# Patient Record
Sex: Female | Born: 1990 | Hispanic: No | Marital: Married | Smoking: Former smoker
Health system: Southern US, Community
[De-identification: ages and names within clinical notes are randomized; demographics above are authoritative.]

## PROBLEM LIST (undated history)

## (undated) DIAGNOSIS — J45909 Unspecified asthma, uncomplicated: Secondary | ICD-10-CM

## (undated) DIAGNOSIS — Z9889 Other specified postprocedural states: Secondary | ICD-10-CM

## (undated) DIAGNOSIS — R112 Nausea with vomiting, unspecified: Secondary | ICD-10-CM

## (undated) HISTORY — PX: ELBOW SURGERY: SHX618

## (undated) HISTORY — DX: Unspecified asthma, uncomplicated: J45.909

## (undated) HISTORY — DX: Other specified postprocedural states: Z98.890

## (undated) HISTORY — DX: Nausea with vomiting, unspecified: R11.2

## (undated) HISTORY — PX: OTHER SURGICAL HISTORY: SHX169

---

## 2018-07-13 ENCOUNTER — Encounter: Payer: Self-pay | Admitting: Adult Health

## 2018-07-13 ENCOUNTER — Ambulatory Visit (INDEPENDENT_AMBULATORY_CARE_PROVIDER_SITE_OTHER): Payer: 59 | Admitting: Adult Health

## 2018-07-13 VITALS — BP 96/72 | HR 98 | Temp 98.1°F | Wt 139.0 lb

## 2018-07-13 DIAGNOSIS — J029 Acute pharyngitis, unspecified: Secondary | ICD-10-CM | POA: Diagnosis not present

## 2018-07-13 LAB — POCT RAPID STREP A (OFFICE): RAPID STREP A SCREEN: NEGATIVE

## 2018-07-13 MED ORDER — PREDNISONE 20 MG PO TABS: 20.0000 mg | ORAL_TABLET | Freq: Every day | ORAL | 0 refills | Status: AC

## 2018-07-13 NOTE — Progress Notes (Signed)
Subjective:    Patient ID: Carla Escobar, female    DOB: March 16, 1991, 27 y.o.   MRN: [id number]  Sore Throat   This is a new problem. The current episode started in the past 7 days (2 days ). The problem has been unchanged. There has been no fever. Associated symptoms include coughing (semi productive ), swollen glands and trouble swallowing. Pertinent negatives include no abdominal pain, ear discharge, ear pain or headaches. She has had no exposure to strep or mono. The treatment provided no relief.    Review of Systems  HENT: Positive for trouble swallowing. Negative for ear discharge and ear pain.   Respiratory: Positive for cough (semi productive ).   Gastrointestinal: Negative for abdominal pain.  Neurological: Negative for headaches.   History reviewed. No pertinent past medical history.  Social History   Socioeconomic History  . Marital status: Unknown    Spouse name: Not on file  . Number of children: Not on file  . Years of education: Not on file  . Highest education level: Not on file  Occupational History  . Not on file  Social Needs  . Financial resource strain: Not on file  . Food insecurity:    Worry: Not on file    Inability: Not on file  . Transportation needs:    Medical: Not on file    Non-medical: Not on file  Tobacco Use  . Smoking status: Never Smoker  . Smokeless tobacco: Never Used  Substance and Sexual Activity  . Alcohol use: Not on file  . Drug use: Not on file  . Sexual activity: Not on file  Lifestyle  . Physical activity:    Days per week: Not on file    Minutes per session: Not on file  . Stress: Not on file  Relationships  . Social connections:    Talks on phone: Not on file    Gets together: Not on file    Attends religious service: Not on file    Active member of club or organization: Not on file    Attends meetings of clubs or organizations: Not on file    Relationship status: Not on file  . Intimate partner violence:    Fear of  current or ex partner: Not on file    Emotionally abused: Not on file    Physically abused: Not on file    Forced sexual activity: Not on file  Other Topics Concern  . Not on file  Social History Narrative  . Not on file    History reviewed. No pertinent surgical history.  History reviewed. No pertinent family history.  Not on File  No current outpatient medications on file prior to visit.   No current facility-administered medications on file prior to visit.     BP 96/72 (BP Location: Left Arm, Patient Position: Sitting, Cuff Size: Normal)   Pulse 98   Temp 98.1 F (36.7 C) (Oral)   Wt 139 lb (63 kg)   SpO2 99%       Objective:   Physical Exam  Constitutional: She is oriented to person, place, and time. She appears well-developed and well-nourished. No distress.  HENT:  Right Ear: Hearing, tympanic membrane, external ear and ear canal normal.  Left Ear: Hearing, tympanic membrane, external ear and ear canal normal.  Nose: Nose normal. No mucosal edema or rhinorrhea. Right sinus exhibits no maxillary sinus tenderness and no frontal sinus tenderness. Left sinus exhibits no maxillary sinus tenderness and no frontal  sinus tenderness.  Mouth/Throat: Uvula is midline. Posterior oropharyngeal erythema present. No oropharyngeal exudate, posterior oropharyngeal edema or tonsillar abscesses.  Neck: Normal range of motion. Neck supple. No thyromegaly present.  Cardiovascular: Normal rate, regular rhythm, normal heart sounds and intact distal pulses.  Pulmonary/Chest: Effort normal and breath sounds normal.  Lymphadenopathy:       Head (right side): Submandibular and tonsillar adenopathy present.       Head (left side): Submandibular and tonsillar adenopathy present.  Neurological: She is alert and oriented to person, place, and time.  Skin: Skin is warm and dry. Capillary refill takes less than 2 seconds. She is not diaphoretic.  Psychiatric: She has a normal mood and affect. Her  behavior is normal. Judgment and thought content normal.  Nursing note and vitals reviewed.     Assessment & Plan:  1. Sore throat  - POC Rapid Strep A- negative  - likely viral  - predniSONE (DELTASONE) 20 MG tablet; Take 1 tablet (20 mg total) by mouth daily with breakfast for 5 days.  Dispense: 5 tablet; Refill: 0 - Follow up in 2-3 days if no improvement   Shirline Frees, NP

## 2018-07-13 NOTE — Patient Instructions (Addendum)
It was great meeting you today   Your strep test is negative   You likely have a viral sore throat. I have sent in prednisone.   Please follow up with me by Friday if you are not feeling any better       Sore Throat A sore throat is pain, burning, irritation, or scratchiness in the throat. When you have a sore throat, you may feel pain or tenderness in your throat when you swallow or talk. Many things can cause a sore throat, including:  An infection.  Seasonal allergies.  Dryness in the air.  Irritants, such as smoke or pollution.  Gastroesophageal reflux disease (GERD).  A tumor.  A sore throat is often the first sign of another sickness. It may happen with other symptoms, such as coughing, sneezing, fever, and swollen neck glands. Most sore throats go away without medical treatment. Follow these instructions at home:  Take over-the-counter medicines only as told by your health care provider.  Drink enough fluids to keep your urine clear or pale yellow.  Rest as needed.  To help with pain, try: ? Sipping warm liquids, such as broth, herbal tea, or warm water. ? Eating or drinking cold or frozen liquids, such as frozen ice pops. ? Gargling with a salt-water mixture 3-4 times a day or as needed. To make a salt-water mixture, completely dissolve -1 tsp of salt in 1 cup of warm water. ? Sucking on hard candy or throat lozenges. ? Putting a cool-mist humidifier in your bedroom at night to moisten the air. ? Sitting in the bathroom with the door closed for 5-10 minutes while you run hot water in the shower.  Do not use any tobacco products, such as cigarettes, chewing tobacco, and e-cigarettes. If you need help quitting, ask your health care provider. Contact a health care provider if:  You have a fever for more than 2-3 days.  You have symptoms that last (are persistent) for more than 2-3 days.  Your throat does not get better within 7 days.  You have a fever and  your symptoms suddenly get worse. Get help right away if:  You have difficulty breathing.  You cannot swallow fluids, soft foods, or your saliva.  You have increased swelling in your throat or neck.  You have persistent nausea and vomiting. This information is not intended to replace advice given to you by your health care provider. Make sure you discuss any questions you have with your health care provider. Document Released: 11/07/2004 Document Revised: 05/26/2016 Document Reviewed: 07/21/2015 Elsevier Interactive Patient Education  [employer].

## 2018-07-23 ENCOUNTER — Ambulatory Visit (INDEPENDENT_AMBULATORY_CARE_PROVIDER_SITE_OTHER): Payer: 59 | Admitting: Family Medicine

## 2018-07-23 ENCOUNTER — Encounter: Payer: Self-pay | Admitting: Family Medicine

## 2018-07-23 VITALS — BP 110/50 | HR 64 | Temp 98.1°F | Ht 65.5 in | Wt 140.0 lb

## 2018-07-23 DIAGNOSIS — J4520 Mild intermittent asthma, uncomplicated: Secondary | ICD-10-CM

## 2018-07-23 DIAGNOSIS — Z13220 Encounter for screening for lipoid disorders: Secondary | ICD-10-CM | POA: Diagnosis not present

## 2018-07-23 DIAGNOSIS — R5383 Other fatigue: Secondary | ICD-10-CM

## 2018-07-23 DIAGNOSIS — Z0000 Encounter for general adult medical examination without abnormal findings: Secondary | ICD-10-CM

## 2018-07-23 DIAGNOSIS — J45909 Unspecified asthma, uncomplicated: Secondary | ICD-10-CM | POA: Insufficient documentation

## 2018-07-23 MED ORDER — LEVALBUTEROL TARTRATE 45 MCG/ACT IN AERO: 1.0000 | INHALATION_SPRAY | RESPIRATORY_TRACT | 5 refills | Status: AC | PRN

## 2018-07-23 NOTE — Progress Notes (Signed)
Carla Escobar DOB: 1991-04-22 Encounter date: 07/23/2018  This isa 27 y.o. female who presents to establish care. Chief Complaint  Patient presents with  . New Patient (Initial Visit)    no new concerns,    History of present illness: Hasn't been seeing doc on regular basis.   Done with law school in 5 weeks. Increased stress and not sleeping as well. Feels this is what has been running down immune system somewhat.  Asthma: Had this when little; has always had inhaler. More exercise induced; sometimes gets chest pain with working out. Had significant testing when 11-12 y/o and found just to be asthma. Doesn't regularly use inhaler now due to palpitations/light headedness when she uses this. Deals with slight wheeze.   Strong faith, relationships. Good support system. Takes Saturday completely off which has been helpful for her. Exercises 4-5 times weekly.   Past Medical History:  Diagnosis Date  . Asthma    Past Surgical History:  Procedure Laterality Date  . none     No Known Allergies No outpatient medications have been marked as taking for the 07/23/18 encounter (Office Visit) with Caren Macadam, MD.   Social History   Tobacco Use  . Smoking status: Never Smoker  . Smokeless tobacco: Never Used  . Tobacco comment: (smoked in high school for 1 year)  Substance Use Topics  . Alcohol use: Yes    Alcohol/week: 1.0 standard drinks    Types: 1 Glasses of wine per week   Family History  Problem Relation Age of Onset  . High blood pressure Father   . Heart disease Father   . Alcohol abuse Father   . Multiple myeloma Maternal Grandmother 76  . Bone cancer Maternal Grandfather 81  . Leukemia Paternal Grandmother   . Bone cancer Paternal Grandfather      Review of Systems  Constitutional: Positive for fatigue. Negative for activity change, appetite change, chills, fever and unexpected weight change.  HENT: Negative for congestion, ear pain, hearing loss, sinus  pressure, sinus pain, sore throat and trouble swallowing.   Eyes: Negative for pain and visual disturbance.  Respiratory: Positive for wheezing. Negative for cough, chest tightness and shortness of breath.   Cardiovascular: Negative for chest pain, palpitations and leg swelling.  Gastrointestinal: Negative for abdominal pain, blood in stool, constipation, diarrhea, nausea and vomiting.  Genitourinary: Negative for difficulty urinating and menstrual problem.  Musculoskeletal: Negative for arthralgias and back pain.  Skin: Negative for rash.  Neurological: Negative for dizziness, weakness, numbness and headaches.  Hematological: Negative for adenopathy. Does not bruise/bleed easily.  Psychiatric/Behavioral: Negative for sleep disturbance and suicidal ideas. The patient is not nervous/anxious.        Some stress but handles this with exercise, good support system/faith (see HPI)     Objective:  BP (!) 110/50 (BP Location: Left Arm, Patient Position: Sitting, Cuff Size: Normal)   Pulse 64   Temp 98.1 F (36.7 C) (Oral)   Ht 5' 5.5" (1.664 m)   Wt 140 lb (63.5 kg)   SpO2 99%   BMI 22.94 kg/m   Weight: 140 lb (63.5 kg)   BP Readings from Last 3 Encounters:  07/23/18 (!) 110/50  07/13/18 96/72   Wt Readings from Last 3 Encounters:  07/23/18 140 lb (63.5 kg)  07/13/18 139 lb (63 kg)    Physical Exam  Constitutional: She is oriented to person, place, and time. She appears well-developed and well-nourished. No distress.  Cardiovascular: Normal rate, regular rhythm and  normal heart sounds. Exam reveals no friction rub.  No murmur heard. No lower extremity edema  Pulmonary/Chest: Effort normal and breath sounds normal. No respiratory distress. She has no wheezes. She has no rales.  Neurological: She is alert and oriented to person, place, and time.  Psychiatric: Her behavior is normal. Cognition and memory are normal.    Assessment/Plan: Preventative Health Care: sees obgyn; have  asked her to have them cc [place] on future visit notes. Keep up regular exercise and healthy eating.  1. Mild intermittent asthma, unspecified whether complicated Trial of Xopenex for asthma symptoms as needed.  She has had tachycardia that is her from using albuterol at times when she feels she might need it.  I do feel that it is worth a try of Xopenex to see if the symptoms are less severe and she can still get the bronchodilation she needs.  She has also not tolerated steroid inhalers due to tachycardia. - levalbuterol (XOPENEX HFA) 45 MCG/ACT inhaler; Inhale 1-2 puffs into the lungs every 4 (four) hours as needed for wheezing.  Dispense: 1 Inhaler; Refill: 5  2. Fatigue, unspecified type She suspects that fatigue is more stress and current lifestyle related.  However I think it is reasonable to obtain just a baseline panel and make sure no obvious abnormalities that would contribute to increased fatigue. - Comprehensive metabolic panel; Future - CBC with Differential/Platelet; Future - TSH; Future  3. Lipid screening  - Lipid panel; Future   Return in about 1 year (around 07/24/2019).  Micheline Rough, MD  Declines flu shot: all times she has had it she has gotten sick. Migraine, vomiting, diarrhea.

## 2018-07-23 NOTE — Patient Instructions (Signed)
Trial of xopenex for asthma symptoms when needed.   bloodwork when able.

## 2018-07-28 ENCOUNTER — Ambulatory Visit: Payer: Self-pay

## 2018-07-28 NOTE — Telephone Encounter (Signed)
Returned call to pt.  Reported she had onset of swollen, tender lymph nodes, and gen. muscle aches last night about 6:00 PM.  Reported her the lymph nodes are swollen in neck, proximal to ears, and under arms.  Reported the lymph nodes are swollen "about lima bean size".  Denied any scratches, or break in skin under her arms.  Stated she has been chilling.  Temperature 98.3 about 12:00 PM.  stated she rarely has fever, even when she is sick; temp. usually is subnormal.  Also c/o sore throat, mild headache, mildly runny nose, and infreq. cough; reported light green mucus from nose, and with intermittent productive cough.  Stated she has been sick with viral symptoms 2 times within past month.  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  Advised she will need to be evaluated for current symptoms.  Appt. given 10/16 @ 3:30 PM.  Care advice given per protocol.  Pt. Verb. Understanding.  Agrees with plan.       Reason for Disposition . [1] Tender node in the neck AND [2] also has a sore throat AND [3] minimal/no runny nose or cough  Answer Assessment - Initial Assessment Questions 1. LOCATION: "Where is the swollen node located?" "Is the matching node on the other side of the body also swollen?"      Swollen lymph nodes in neck, prox to ears, bilat, and axillary lymph nodes 2. SIZE: "How big is the node?" (Inches or centimeters) (or compare to common objects such as pea, bean, marble, golf ball)      "Lima bean size" 3. ONSET: "When did the swelling start?"      6:00 PM 10/14 4. NECK NODES: "Is there a sore throat, runny nose or other symptoms of a cold?"      Denied sore throat, runny nose of light green; infreq cough of light green mucus 5. GROIN OR ARMPIT NODES: "Is there a sore, scratch, cut or painful red area on that arm or leg?"      Denied any sore area under arm; denied any redness or skin changes  6. FEVER: "Do you  have a fever?" If so, ask: "What is it, how was it measured, and when did it start?"      Denied fever; reported she usually has a low body temperature; reported chills last night ; Temp. 98.3 7. CAUSE: "What do you think is causing the swollen lymph nodes?"     In [employer]; has been exposed to other students with cough, fever, aching  8. OTHER SYMPTOMS: "Do you have any other symptoms?"     Mild headache  9. PREGNANCY: "Is there any chance you are pregnant?" "When was your last menstrual period?"     LMP 5 days ago; has an IUD  Protocols used: LYMPH NODES [facility]  Message from Tamela Oddi sent at 07/28/2018 12:25 PM EDT   Patient called to speak with a nurse because she is having flu like symptoms such as lymph nodes swollen, achy all over, and this is the third time this month that she has been sick. Patient stated that she has seen the doctor 2x this month and wanted to streamline and speak with a nurse to get an alternative solution instead of seeing the doctor again. Patient's CB# [phone].

## 2018-07-29 ENCOUNTER — Ambulatory Visit (INDEPENDENT_AMBULATORY_CARE_PROVIDER_SITE_OTHER): Payer: 59 | Admitting: Adult Health

## 2018-07-29 ENCOUNTER — Encounter: Payer: Self-pay | Admitting: Adult Health

## 2018-07-29 VITALS — BP 100/60 | Temp 98.3°F | Wt 140.0 lb

## 2018-07-29 DIAGNOSIS — Z13220 Encounter for screening for lipoid disorders: Secondary | ICD-10-CM | POA: Diagnosis not present

## 2018-07-29 DIAGNOSIS — B349 Viral infection, unspecified: Secondary | ICD-10-CM

## 2018-07-29 DIAGNOSIS — R5383 Other fatigue: Secondary | ICD-10-CM | POA: Diagnosis not present

## 2018-07-29 LAB — POCT MONO (EPSTEIN BARR VIRUS): MONO, POC: NEGATIVE

## 2018-07-29 NOTE — Progress Notes (Signed)
 Subjective:    Patient ID: Carla Escobar, female    DOB: 05/18/1991, 27 y.o.   MRN: [id number]  HPI  27 year old female who  has a past medical history of Asthma.  She presents to the office today for continued headache, sore throat chills/subjective fever, achy muscles, fatigue, and cough. She was seen by this writer about two weeks ago for similar symptoms. Strep was negative and it was thought that her symptoms were likely viral in nature or do to lifestyle ( law school). She was prescribed prednisone to help with sore throat symptoms. She reports that she never took this medication as she started to feel better.   Since that time she has been seen by her PCP, labs were entered and she will have these done today. Her symptoms have been waxing and waning, worse in the morning.   Her husband has not become ill.   She has had two friends recently diagnosed with mono.    Review of Systems See HPI   Past Medical History:  Diagnosis Date  . Asthma     Social History   Socioeconomic History  . Marital status: Married    Spouse name: Not on file  . Number of children: Not on file  . Years of education: Not on file  . Highest education level: Not on file  Occupational History  . Not on file  Social Needs  . Financial resource strain: Not on file  . Food insecurity:    Worry: Not on file    Inability: Not on file  . Transportation needs:    Medical: Not on file    Non-medical: Not on file  Tobacco Use  . Smoking status: Never Smoker  . Smokeless tobacco: Never Used  . Tobacco comment: (smoked in high school for 1 year)  Substance and Sexual Activity  . Alcohol use: Yes    Alcohol/week: 1.0 standard drinks    Types: 1 Glasses of wine per week  . Drug use: Never  . Sexual activity: Yes    Partners: Male  Lifestyle  . Physical activity:    Days per week: Not on file    Minutes per session: Not on file  . Stress: Not on file  Relationships  . Social  connections:    Talks on phone: Not on file    Gets together: Not on file    Attends religious service: Not on file    Active member of club or organization: Not on file    Attends meetings of clubs or organizations: Not on file    Relationship status: Not on file  . Intimate partner violence:    Fear of current or ex partner: Not on file    Emotionally abused: Not on file    Physically abused: Not on file    Forced sexual activity: Not on file  Other Topics Concern  . Not on file  Social History Narrative  . Not on file    Past Surgical History:  Procedure Laterality Date  . none      Family History  Problem Relation Age of Onset  . High blood pressure Father   . Heart disease Father   . Alcohol abuse Father   . Multiple myeloma Maternal Grandmother 76  . Bone cancer Maternal Grandfather 73  . Leukemia Paternal Grandmother   . Bone cancer Paternal Grandfather     No Known Allergies  Current Outpatient Medications on File Prior to Visit    Medication Sig Dispense Refill  . levalbuterol (XOPENEX HFA) 45 MCG/ACT inhaler Inhale 1-2 puffs into the lungs every 4 (four) hours as needed for wheezing. (Patient not taking: Reported on 07/29/2018) 1 Inhaler 5   No current facility-administered medications on file prior to visit.     BP 100/60   Temp 98.3 F (36.8 C) (Oral)   Wt 140 lb (63.5 kg)   BMI 22.94 kg/m       Objective:   Physical Exam  Constitutional: She is oriented to person, place, and time. She appears well-developed and well-nourished. No distress.  HENT:  Head: Normocephalic and atraumatic.  Right Ear: External ear normal.  Left Ear: External ear normal.  Nose: Nose normal.  Mouth/Throat: Oropharynx is clear and moist. No oropharyngeal exudate.  Eyes: Pupils are equal, round, and reactive to light. Conjunctivae and EOM are normal. Right eye exhibits no discharge. Left eye exhibits no discharge. No scleral icterus.  Neck: Normal range of motion. Neck  supple. No thyromegaly present.  Cardiovascular: Normal rate, regular rhythm, normal heart sounds and intact distal pulses. Exam reveals no gallop and no friction rub.  No murmur heard. Pulmonary/Chest: Effort normal and breath sounds normal. No stridor. No respiratory distress. She has no wheezes. She has no rales. She exhibits no tenderness.  Abdominal: Soft. Bowel sounds are normal. She exhibits no distension and no mass. There is no hepatosplenomegaly, splenomegaly or hepatomegaly. There is no tenderness. There is no rebound and no guarding. No hernia.  Lymphadenopathy:       Head (right side): No submental, no submandibular, no tonsillar, no preauricular, no posterior auricular and no occipital adenopathy present.       Head (left side): No submental, no submandibular, no tonsillar, no preauricular, no posterior auricular and no occipital adenopathy present.    She has no cervical adenopathy.  Neurological: She is alert and oriented to person, place, and time.  Skin: Skin is warm and dry. She is not diaphoretic.  Psychiatric: She has a normal mood and affect. Her behavior is normal. Judgment and thought content normal.  Nursing note and vitals reviewed.     Assessment & Plan:  1. Acute viral syndrome - Will check POC mono. Unknown cause of illness but possibly related to lifestyle and stressors.  - POC Mono (Epstein Barr Virus)  2. Fatigue, unspecified type  - TSH - CBC with Differential/Platelet - Comprehensive metabolic panel  3. Lipid screening - Lipid panel   Dorothyann Peng, NP

## 2018-07-30 ENCOUNTER — Telehealth: Payer: Self-pay | Admitting: Family Medicine

## 2018-07-30 LAB — CBC WITH DIFFERENTIAL/PLATELET
BASOS ABS: 0.1 10*3/uL (ref 0.0–0.1)
Basophils Relative: 1 % (ref 0.0–3.0)
Eosinophils Absolute: 0.3 10*3/uL (ref 0.0–0.7)
Eosinophils Relative: 5 % (ref 0.0–5.0)
HCT: 43.3 % (ref 36.0–46.0)
HEMOGLOBIN: 14.4 g/dL (ref 12.0–15.0)
LYMPHS PCT: 27 % (ref 12.0–46.0)
Lymphs Abs: 1.7 10*3/uL (ref 0.7–4.0)
MCHC: 33.2 g/dL (ref 30.0–36.0)
MCV: 85.9 fl (ref 78.0–100.0)
MONO ABS: 0.8 10*3/uL (ref 0.1–1.0)
Monocytes Relative: 12.9 % — ABNORMAL HIGH (ref 3.0–12.0)
Neutro Abs: 3.4 10*3/uL (ref 1.4–7.7)
Neutrophils Relative %: 54.1 % (ref 43.0–77.0)
Platelets: 272 10*3/uL (ref 150.0–400.0)
RBC: 5.04 Mil/uL (ref 3.87–5.11)
RDW: 13 % (ref 11.5–15.5)
WBC: 6.3 10*3/uL (ref 4.0–10.5)

## 2018-07-30 LAB — COMPREHENSIVE METABOLIC PANEL
ALK PHOS: 50 U/L (ref 39–117)
ALT: 15 U/L (ref 0–35)
AST: 17 U/L (ref 0–37)
Albumin: 4.9 g/dL (ref 3.5–5.2)
BUN: 12 mg/dL (ref 6–23)
CO2: 27 meq/L (ref 19–32)
Calcium: 9.9 mg/dL (ref 8.4–10.5)
Chloride: 102 mEq/L (ref 96–112)
Creatinine, Ser: 0.87 mg/dL (ref 0.40–1.20)
GFR: 84.04 mL/min (ref >60.00)
GLUCOSE: 89 mg/dL (ref 70–99)
POTASSIUM: 4.2 meq/L (ref 3.5–5.1)
Sodium: 139 mEq/L (ref 135–145)
TOTAL PROTEIN: 7.5 g/dL (ref 6.0–8.3)
Total Bilirubin: 0.3 mg/dL (ref 0.2–1.2)

## 2018-07-30 LAB — LIPID PANEL
CHOL/HDL RATIO: 2
Cholesterol: 165 mg/dL (ref 0–200)
HDL: 73.7 mg/dL (ref >39.00)
LDL CALC: 79 mg/dL (ref 0–99)
NonHDL: 91.14
TRIGLYCERIDES: 62 mg/dL (ref 0.0–149.0)
VLDL: 12.4 mg/dL (ref 0.0–40.0)

## 2018-07-30 LAB — TSH: TSH: 2.07 u[IU]/mL (ref 0.35–4.50)

## 2018-07-30 NOTE — Telephone Encounter (Signed)
Copied from CRM [id number]. Topic: Quick Communication - See Telephone Encounter >> Jul 30, 2018  4:46 PM Jens Som A wrote: CRM for notification. See Telephone encounter for: 07/30/18.  Patient is requesting lab results please advise [phone]

## 2018-07-31 NOTE — Telephone Encounter (Signed)
Left message for patient to call back. CRM created. Please see result notes for further documentation

## 2019-01-31 ENCOUNTER — Encounter (HOSPITAL_COMMUNITY): Payer: Self-pay

## 2019-01-31 ENCOUNTER — Emergency Department (HOSPITAL_COMMUNITY)
Admission: EM | Admit: 2019-01-31 | Discharge: 2019-01-31 | Disposition: A | Discharge status: Home / Self Care | Payer: 59 | Attending: Emergency Medicine | Admitting: Emergency Medicine

## 2019-01-31 ENCOUNTER — Emergency Department (HOSPITAL_COMMUNITY): Payer: 59

## 2019-01-31 DIAGNOSIS — S52125A Nondisplaced fracture of head of left radius, initial encounter for closed fracture: Secondary | ICD-10-CM | POA: Diagnosis not present

## 2019-01-31 DIAGNOSIS — Y999 Unspecified external cause status: Secondary | ICD-10-CM | POA: Diagnosis not present

## 2019-01-31 DIAGNOSIS — Y929 Unspecified place or not applicable: Secondary | ICD-10-CM | POA: Diagnosis not present

## 2019-01-31 DIAGNOSIS — J45909 Unspecified asthma, uncomplicated: Secondary | ICD-10-CM | POA: Insufficient documentation

## 2019-01-31 DIAGNOSIS — S59912A Unspecified injury of left forearm, initial encounter: Secondary | ICD-10-CM | POA: Diagnosis present

## 2019-01-31 DIAGNOSIS — Y9355 Activity, bike riding: Secondary | ICD-10-CM | POA: Insufficient documentation

## 2019-01-31 DIAGNOSIS — R52 Pain, unspecified: Secondary | ICD-10-CM

## 2019-01-31 MED ORDER — MORPHINE SULFATE (PF) 4 MG/ML IV SOLN
4.0000 mg | Freq: Once | INTRAVENOUS | Status: DC
  Filled 2019-01-31: qty 1

## 2019-01-31 MED ORDER — HYDROCODONE-ACETAMINOPHEN 5-325 MG PO TABS
1.0000 | ORAL_TABLET | Freq: Once | ORAL | Status: AC
  Administered 2019-01-31: 1 via ORAL
  Filled 2019-01-31: qty 1

## 2019-01-31 MED ORDER — ONDANSETRON HCL 4 MG/2ML IJ SOLN
4.0000 mg | Freq: Once | INTRAMUSCULAR | Status: DC
  Filled 2019-01-31: qty 2

## 2019-01-31 MED ORDER — HYDROCODONE-ACETAMINOPHEN 5-325 MG PO TABS: 1.0000 | ORAL_TABLET | ORAL | 0 refills | Status: DC | PRN

## 2019-01-31 NOTE — ED Notes (Signed)
Patient verbalizes understanding of discharge instructions . Opportunity for questions and answers were provided . Armband removed by staff ,Pt discharged from ED. W/C  offered at D/C  and Declined W/C at D/C and was escorted to lobby by RN.  
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
## 2019-01-31 NOTE — ED Triage Notes (Addendum)
Unable to gain IV acces . EDP informed Pt wanted a PO pain meds

## 2019-01-31 NOTE — Discharge Instructions (Signed)
Call [facility] tomorrow morning for an appointment with Dr. Roney Mans this week.

## 2019-01-31 NOTE — ED Notes (Signed)
Patient transported to X-ray 
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
## 2020-01-27 DIAGNOSIS — Z30432 Encounter for removal of intrauterine contraceptive device: Secondary | ICD-10-CM | POA: Diagnosis not present

## 2020-01-27 DIAGNOSIS — Z124 Encounter for screening for malignant neoplasm of cervix: Secondary | ICD-10-CM | POA: Diagnosis not present

## 2020-01-27 DIAGNOSIS — Z01419 Encounter for gynecological examination (general) (routine) without abnormal findings: Secondary | ICD-10-CM | POA: Diagnosis not present

## 2020-01-27 DIAGNOSIS — Z1151 Encounter for screening for human papillomavirus (HPV): Secondary | ICD-10-CM | POA: Diagnosis not present

## 2020-03-08 DIAGNOSIS — Z3201 Encounter for pregnancy test, result positive: Secondary | ICD-10-CM | POA: Diagnosis not present

## 2020-03-08 DIAGNOSIS — Z3689 Encounter for other specified antenatal screening: Secondary | ICD-10-CM | POA: Diagnosis not present

## 2020-03-27 DIAGNOSIS — Z3201 Encounter for pregnancy test, result positive: Secondary | ICD-10-CM | POA: Diagnosis not present

## 2020-04-11 DIAGNOSIS — Z3689 Encounter for other specified antenatal screening: Secondary | ICD-10-CM | POA: Diagnosis not present

## 2020-04-11 DIAGNOSIS — Z36 Encounter for antenatal screening for chromosomal anomalies: Secondary | ICD-10-CM | POA: Diagnosis not present

## 2020-04-11 DIAGNOSIS — Z3401 Encounter for supervision of normal first pregnancy, first trimester: Secondary | ICD-10-CM | POA: Diagnosis not present

## 2020-04-11 LAB — OB RESULTS CONSOLE HIV ANTIBODY (ROUTINE TESTING): HIV: NONREACTIVE

## 2020-04-11 LAB — OB RESULTS CONSOLE HEPATITIS B SURFACE ANTIGEN: Hepatitis B Surface Ag: NEGATIVE

## 2020-04-11 LAB — OB RESULTS CONSOLE RPR: RPR: NONREACTIVE

## 2020-04-11 LAB — OB RESULTS CONSOLE RUBELLA ANTIBODY, IGM: Rubella: IMMUNE

## 2020-04-14 DIAGNOSIS — Z3401 Encounter for supervision of normal first pregnancy, first trimester: Secondary | ICD-10-CM | POA: Diagnosis not present

## 2020-05-02 DIAGNOSIS — Z3401 Encounter for supervision of normal first pregnancy, first trimester: Secondary | ICD-10-CM | POA: Diagnosis not present

## 2020-05-02 LAB — OB RESULTS CONSOLE GC/CHLAMYDIA
Chlamydia: NEGATIVE
Gonorrhea: NEGATIVE

## 2020-05-22 DIAGNOSIS — Z3402 Encounter for supervision of normal first pregnancy, second trimester: Secondary | ICD-10-CM | POA: Diagnosis not present

## 2020-05-22 DIAGNOSIS — Z361 Encounter for antenatal screening for raised alphafetoprotein level: Secondary | ICD-10-CM | POA: Diagnosis not present

## 2020-06-09 DIAGNOSIS — O358XX0 Maternal care for other (suspected) fetal abnormality and damage, not applicable or unspecified: Secondary | ICD-10-CM | POA: Diagnosis not present

## 2020-06-09 DIAGNOSIS — Z3A18 18 weeks gestation of pregnancy: Secondary | ICD-10-CM | POA: Diagnosis not present
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
## 2020-07-04 DIAGNOSIS — O358XX0 Maternal care for other (suspected) fetal abnormality and damage, not applicable or unspecified: Secondary | ICD-10-CM | POA: Diagnosis not present

## 2020-07-04 DIAGNOSIS — Z3A22 22 weeks gestation of pregnancy: Secondary | ICD-10-CM | POA: Diagnosis not present

## 2020-07-28 DIAGNOSIS — Z3A25 25 weeks gestation of pregnancy: Secondary | ICD-10-CM | POA: Diagnosis not present

## 2020-07-28 DIAGNOSIS — O358XX0 Maternal care for other (suspected) fetal abnormality and damage, not applicable or unspecified: Secondary | ICD-10-CM | POA: Diagnosis not present

## 2020-08-07 DIAGNOSIS — Z3689 Encounter for other specified antenatal screening: Secondary | ICD-10-CM | POA: Diagnosis not present

## 2020-08-07 DIAGNOSIS — O358XX0 Maternal care for other (suspected) fetal abnormality and damage, not applicable or unspecified: Secondary | ICD-10-CM | POA: Diagnosis not present

## 2020-08-07 DIAGNOSIS — Z3A27 27 weeks gestation of pregnancy: Secondary | ICD-10-CM | POA: Diagnosis not present

## 2020-08-14 DIAGNOSIS — O360130 Maternal care for anti-D [Rh] antibodies, third trimester, not applicable or unspecified: Secondary | ICD-10-CM | POA: Diagnosis not present

## 2020-08-14 DIAGNOSIS — Z3A28 28 weeks gestation of pregnancy: Secondary | ICD-10-CM | POA: Diagnosis not present

## 2020-08-25 DIAGNOSIS — O360130 Maternal care for anti-D [Rh] antibodies, third trimester, not applicable or unspecified: Secondary | ICD-10-CM | POA: Diagnosis not present

## 2020-08-25 DIAGNOSIS — Z3A29 29 weeks gestation of pregnancy: Secondary | ICD-10-CM | POA: Diagnosis not present

## 2020-09-04 DIAGNOSIS — Z3A31 31 weeks gestation of pregnancy: Secondary | ICD-10-CM | POA: Diagnosis not present

## 2020-09-04 DIAGNOSIS — O360130 Maternal care for anti-D [Rh] antibodies, third trimester, not applicable or unspecified: Secondary | ICD-10-CM | POA: Diagnosis not present

## 2020-10-05 DIAGNOSIS — R8290 Unspecified abnormal findings in urine: Secondary | ICD-10-CM | POA: Diagnosis not present

## 2020-10-05 DIAGNOSIS — Z3685 Encounter for antenatal screening for Streptococcus B: Secondary | ICD-10-CM | POA: Diagnosis not present

## 2020-10-10 LAB — OB RESULTS CONSOLE GBS: GBS: POSITIVE

## 2020-10-12 DIAGNOSIS — Z23 Encounter for immunization: Secondary | ICD-10-CM | POA: Diagnosis not present

## 2020-10-14 NOTE — L&D Delivery Note (Signed)
Operative Delivery Note At 10:16 PM a viable and healthy female was delivered via Vaginal, [employer], operational).  Presentation: vertex; Position: Right,, Occiput,, Anterior; Station: +3.  Verbal consent: obtained from patient.  Risks and benefits discussed in detail.  Risks include, but are not limited to the risks of anesthesia, bleeding, infection, damage to maternal tissues, fetal cephalhematoma.  There is also the risk of inability to effect vaginal delivery of the head, or shoulder dystocia that cannot be resolved by established maneuvers, leading to the need for emergency cesarean section.  APGAR: 9, 9; weight  pending.   Placenta status: spontaneous, intact.   Cord:  with the following complications: loose  x one reduced on perineum.  Cord pH: na  Anesthesia:epidural  Instruments: Kiwi x 2 pulls Episiotomy: None Lacerations: 3rd degree, 3b Suture Repair: 2.0 vicryl and 0 vicryl Est. Blood Loss (mL):  400  Mom to postpartum.  Baby to Couplet care / Skin to Skin.  Fenris Cauble J 11/10/2020, 10:46 PM

## 2020-11-02 ENCOUNTER — Telehealth (HOSPITAL_COMMUNITY): Payer: Self-pay | Admitting: *Deleted

## 2020-11-02 ENCOUNTER — Encounter (HOSPITAL_COMMUNITY): Payer: Self-pay | Admitting: *Deleted

## 2020-11-02 NOTE — Telephone Encounter (Signed)
Preadmission screen  

## 2020-11-08 ENCOUNTER — Other Ambulatory Visit (HOSPITAL_COMMUNITY): Payer: BC Managed Care – PPO | Attending: Obstetrics and Gynecology

## 2020-11-08 ENCOUNTER — Other Ambulatory Visit: Payer: Self-pay | Admitting: Obstetrics and Gynecology

## 2020-11-10 ENCOUNTER — Other Ambulatory Visit: Payer: Self-pay

## 2020-11-10 ENCOUNTER — Inpatient Hospital Stay (HOSPITAL_COMMUNITY): Payer: BC Managed Care – PPO

## 2020-11-10 ENCOUNTER — Inpatient Hospital Stay (HOSPITAL_COMMUNITY)
Admission: AD | Admit: 2020-11-10 | Discharge: 2020-11-12 | DRG: 768 | Disposition: A | Discharge status: Home / Self Care | Payer: BC Managed Care – PPO | Attending: Obstetrics and Gynecology | Admitting: Obstetrics and Gynecology

## 2020-11-10 ENCOUNTER — Inpatient Hospital Stay (HOSPITAL_COMMUNITY): Payer: BC Managed Care – PPO | Admitting: Anesthesiology

## 2020-11-10 ENCOUNTER — Encounter (HOSPITAL_COMMUNITY): Payer: Self-pay | Admitting: Obstetrics and Gynecology

## 2020-11-10 DIAGNOSIS — J45909 Unspecified asthma, uncomplicated: Secondary | ICD-10-CM | POA: Diagnosis present

## 2020-11-10 DIAGNOSIS — Z20822 Contact with and (suspected) exposure to covid-19: Secondary | ICD-10-CM | POA: Diagnosis present

## 2020-11-10 DIAGNOSIS — O9952 Diseases of the respiratory system complicating childbirth: Secondary | ICD-10-CM | POA: Diagnosis present

## 2020-11-10 DIAGNOSIS — O99824 Streptococcus B carrier state complicating childbirth: Secondary | ICD-10-CM | POA: Diagnosis present

## 2020-11-10 DIAGNOSIS — D649 Anemia, unspecified: Secondary | ICD-10-CM | POA: Diagnosis present

## 2020-11-10 DIAGNOSIS — Z3490 Encounter for supervision of normal pregnancy, unspecified, unspecified trimester: Secondary | ICD-10-CM | POA: Diagnosis present

## 2020-11-10 DIAGNOSIS — Z87891 Personal history of nicotine dependence: Secondary | ICD-10-CM

## 2020-11-10 DIAGNOSIS — O480 Post-term pregnancy: Principal | ICD-10-CM | POA: Diagnosis present

## 2020-11-10 DIAGNOSIS — O9902 Anemia complicating childbirth: Secondary | ICD-10-CM | POA: Diagnosis present

## 2020-11-10 DIAGNOSIS — Z3A40 40 weeks gestation of pregnancy: Secondary | ICD-10-CM | POA: Diagnosis not present

## 2020-11-10 LAB — CBC
HCT: 38.1 % (ref 36.0–46.0)
Hemoglobin: 12.6 g/dL (ref 12.0–15.0)
MCH: 28.9 pg (ref 26.0–34.0)
MCHC: 33.1 g/dL (ref 30.0–36.0)
MCV: 87.4 fL (ref 80.0–100.0)
Platelets: 189 10*3/uL (ref 150–400)
RBC: 4.36 MIL/uL (ref 3.87–5.11)
RDW: 12.4 % (ref 11.5–15.5)
WBC: 10.3 10*3/uL (ref 4.0–10.5)
nRBC: 0 % (ref 0.0–0.2)

## 2020-11-10 LAB — TYPE AND SCREEN
ABO/RH(D): A NEG
Antibody Screen: NEGATIVE

## 2020-11-10 LAB — SARS CORONAVIRUS 2 BY RT PCR (HOSPITAL ORDER, PERFORMED IN ~~LOC~~ HOSPITAL LAB): SARS Coronavirus 2: NEGATIVE

## 2020-11-10 LAB — RPR: RPR Ser Ql: NONREACTIVE

## 2020-11-10 MED ORDER — OXYTOCIN-SODIUM CHLORIDE 30-0.9 UT/500ML-% IV SOLN: 2.5000 [IU]/h | INTRAVENOUS | Status: DC

## 2020-11-10 MED ORDER — SODIUM CHLORIDE 0.9 % IV SOLN
5.0000 10*6.[IU] | Freq: Once | INTRAVENOUS | Status: AC
  Administered 2020-11-10: 5 10*6.[IU] via INTRAVENOUS
  Filled 2020-11-10: qty 5

## 2020-11-10 MED ORDER — EPHEDRINE 5 MG/ML INJ: 10.0000 mg | INTRAVENOUS | Status: DC | PRN

## 2020-11-10 MED ORDER — LACTATED RINGERS IV SOLN
500.0000 mL | INTRAVENOUS | Status: DC | PRN
  Administered 2020-11-10 (×4): 500 mL via INTRAVENOUS

## 2020-11-10 MED ORDER — FENTANYL CITRATE (PF) 100 MCG/2ML IJ SOLN
INTRAMUSCULAR | Status: DC | PRN
  Administered 2020-11-10: 100 ug via EPIDURAL

## 2020-11-10 MED ORDER — DIPHENHYDRAMINE HCL 50 MG/ML IJ SOLN: 12.5000 mg | INTRAMUSCULAR | Status: DC | PRN

## 2020-11-10 MED ORDER — FENTANYL-BUPIVACAINE-NACL 0.5-0.125-0.9 MG/250ML-% EP SOLN
EPIDURAL | Status: DC | PRN
  Administered 2020-11-10: 12 mL/h via EPIDURAL

## 2020-11-10 MED ORDER — TRANEXAMIC ACID-NACL 1000-0.7 MG/100ML-% IV SOLN: 1000.0000 mg | Freq: Once | INTRAVENOUS | Status: DC

## 2020-11-10 MED ORDER — TERBUTALINE SULFATE 1 MG/ML IJ SOLN: 0.2500 mg | Freq: Once | INTRAMUSCULAR | Status: DC | PRN

## 2020-11-10 MED ORDER — PHENYLEPHRINE HCL (PRESSORS) 10 MG/ML IV SOLN
INTRAVENOUS | Status: DC | PRN
  Administered 2020-11-10: 120 ug via INTRAVENOUS
  Administered 2020-11-10: 80 ug via INTRAVENOUS
  Administered 2020-11-10: 40 ug via INTRAVENOUS
  Administered 2020-11-10 (×2): 80 ug via INTRAVENOUS

## 2020-11-10 MED ORDER — TRANEXAMIC ACID-NACL 1000-0.7 MG/100ML-% IV SOLN
INTRAVENOUS | Status: AC
  Administered 2020-11-10: 1000 mg
  Filled 2020-11-10: qty 100

## 2020-11-10 MED ORDER — FENTANYL-BUPIVACAINE-NACL 0.5-0.125-0.9 MG/250ML-% EP SOLN: 12.0000 mL/h | EPIDURAL | Status: DC | PRN

## 2020-11-10 MED ORDER — PHENYLEPHRINE 40 MCG/ML (10ML) SYRINGE FOR IV PUSH (FOR BLOOD PRESSURE SUPPORT)
PREFILLED_SYRINGE | INTRAVENOUS | Status: AC
  Filled 2020-11-10: qty 10

## 2020-11-10 MED ORDER — LIDOCAINE HCL (PF) 1 % IJ SOLN: 30.0000 mL | INTRAMUSCULAR | Status: DC | PRN

## 2020-11-10 MED ORDER — FENTANYL-BUPIVACAINE-NACL 0.5-0.125-0.9 MG/250ML-% EP SOLN
EPIDURAL | Status: AC
  Filled 2020-11-10: qty 250

## 2020-11-10 MED ORDER — ACETAMINOPHEN 325 MG PO TABS: 650.0000 mg | ORAL_TABLET | ORAL | Status: DC | PRN

## 2020-11-10 MED ORDER — PENICILLIN G POT IN DEXTROSE 60000 UNIT/ML IV SOLN
3.0000 10*6.[IU] | INTRAVENOUS | Status: DC
  Administered 2020-11-10 (×4): 3 10*6.[IU] via INTRAVENOUS
  Filled 2020-11-10 (×4): qty 50

## 2020-11-10 MED ORDER — EPHEDRINE SULFATE 50 MG/ML IJ SOLN
INTRAMUSCULAR | Status: DC | PRN
  Administered 2020-11-10: 5 mg via INTRAVENOUS

## 2020-11-10 MED ORDER — PHENYLEPHRINE 40 MCG/ML (10ML) SYRINGE FOR IV PUSH (FOR BLOOD PRESSURE SUPPORT): 80.0000 ug | PREFILLED_SYRINGE | INTRAVENOUS | Status: DC | PRN

## 2020-11-10 MED ORDER — OXYTOCIN-SODIUM CHLORIDE 30-0.9 UT/500ML-% IV SOLN
1.0000 m[IU]/min | INTRAVENOUS | Status: DC
  Filled 2020-11-10: qty 500

## 2020-11-10 MED ORDER — FENTANYL CITRATE (PF) 100 MCG/2ML IJ SOLN
100.0000 ug | INTRAMUSCULAR | Status: DC | PRN
  Administered 2020-11-10: 100 ug via INTRAVENOUS
  Filled 2020-11-10 (×2): qty 2

## 2020-11-10 MED ORDER — MISOPROSTOL 25 MCG QUARTER TABLET
25.0000 ug | ORAL_TABLET | ORAL | Status: DC | PRN
  Administered 2020-11-10 (×2): 25 ug via VAGINAL
  Filled 2020-11-10 (×2): qty 1

## 2020-11-10 MED ORDER — SOD CITRATE-CITRIC ACID 500-334 MG/5ML PO SOLN: 30.0000 mL | ORAL | Status: DC | PRN

## 2020-11-10 MED ORDER — OXYTOCIN BOLUS FROM INFUSION
333.0000 mL | Freq: Once | INTRAVENOUS | Status: AC
  Administered 2020-11-10: 333 mL via INTRAVENOUS

## 2020-11-10 MED ORDER — BUPIVACAINE HCL (PF) 0.25 % IJ SOLN
INTRAMUSCULAR | Status: DC | PRN
  Administered 2020-11-10: 1 mL via INTRATHECAL

## 2020-11-10 MED ORDER — LACTATED RINGERS IV SOLN: 500.0000 mL | Freq: Once | INTRAVENOUS | Status: DC

## 2020-11-10 MED ORDER — LACTATED RINGERS IV SOLN: INTRAVENOUS | Status: DC

## 2020-11-10 MED ORDER — EPHEDRINE 5 MG/ML INJ
10.0000 mg | INTRAVENOUS | Status: DC | PRN
  Filled 2020-11-10: qty 10

## 2020-11-10 MED ORDER — ONDANSETRON HCL 4 MG/2ML IJ SOLN
4.0000 mg | Freq: Four times a day (QID) | INTRAMUSCULAR | Status: DC | PRN
  Administered 2020-11-10: 4 mg via INTRAVENOUS
  Filled 2020-11-10: qty 2

## 2020-11-10 MED ORDER — PHENYLEPHRINE 40 MCG/ML (10ML) SYRINGE FOR IV PUSH (FOR BLOOD PRESSURE SUPPORT)
80.0000 ug | PREFILLED_SYRINGE | INTRAVENOUS | Status: DC | PRN
  Filled 2020-11-10: qty 10

## 2020-11-10 NOTE — Anesthesia Preprocedure Evaluation (Signed)
Anesthesia Evaluation  Patient identified by MRN, date of birth, ID band Patient awake    Reviewed: Allergy & Precautions, Patient's Chart, lab work & pertinent test results  History of Anesthesia Complications (+) PONV and history of anesthetic complications  Airway Mallampati: II  TM Distance: >3 FB Neck ROM: Full    Dental no notable dental hx.    Pulmonary asthma , former smoker,    Pulmonary exam normal breath sounds clear to auscultation       Cardiovascular negative cardio ROS Normal cardiovascular exam Rhythm:Regular Rate:Normal     Neuro/Psych negative neurological ROS  negative psych ROS   GI/Hepatic negative GI ROS, Neg liver ROS,   Endo/Other  negative endocrine ROS  Renal/GU negative Renal ROS  negative genitourinary   Musculoskeletal negative musculoskeletal ROS (+)   Abdominal   Peds negative pediatric ROS (+)  Hematology negative hematology ROS (+) hct 38.1, plt 189   Anesthesia Other Findings   Reproductive/Obstetrics (+) Pregnancy G1P0 "natural delivery" stalled at 9.5cm, requesting epidural                              Anesthesia Physical Anesthesia Plan  ASA: II and emergent  Anesthesia Plan: Epidural   Post-op Pain Management:    Induction:   PONV Risk Score and Plan: 2  Airway Management Planned: Natural Airway  Additional Equipment: None  Intra-op Plan:   Post-operative Plan:   Informed Consent: I have reviewed the patients History and Physical, chart, labs and discussed the procedure including the risks, benefits and alternatives for the proposed anesthesia with the patient or authorized representative who has indicated his/her understanding and acceptance.       Plan Discussed with:   Anesthesia Plan Comments:         Anesthesia Quick Evaluation

## 2020-11-10 NOTE — Progress Notes (Signed)
Carla Escobar is a 30 y.o. G1P0 at [redacted]w[redacted]d by LMP admitted for induction of labor due to postdates.  Subjective:   Objective: BP (!) 107/50   Pulse 70   Temp 97.9 F (36.6 C) (Oral)   Resp 16   Ht 5\' 6"  (1.676 m)   Wt 89.4 kg   BMI 31.80 kg/m  No intake/output data recorded. No intake/output data recorded.  FHT:  FHR: 145 bpm, variability: moderate,  accelerations:  Present,  decelerations:  Absent UC:   regular, every 2 minutes SVE:   Dilation: 9 Effacement (%): 90 Station: 0 Exam by:: Kieren Ricci, MD  Caput noted  Labs: Lab Results  Component Value Date   WBC 10.3 11/10/2020   HGB 12.6 11/10/2020   HCT 38.1 11/10/2020   MCV 87.4 11/10/2020   PLT 189 11/10/2020    Assessment / Plan: Active labor s/p cytotec Prolonged active phase with poor descent of vertex  Labor: No change x 1/1/2 hrs Preeclampsia:  no signs or symptoms of toxicity Fetal Wellbeing:  Category I Pain Control:  IV pain meds I/D:  n/a Anticipated MOD:  Guarded. Would benefit from epidural but declines at this time.   Jamaal Bernasconi J 11/10/2020, 2:04 PM

## 2020-11-10 NOTE — Anesthesia Procedure Notes (Signed)
Epidural Patient location during procedure: OB Start time: 11/10/2020 4:26 PM End time: 11/10/2020 4:39 PM  Staffing Anesthesiologist: Lannie Fields, DO Performed: anesthesiologist   Preanesthetic Checklist Completed: patient identified, IV checked, risks and benefits discussed, monitors and equipment checked, pre-op evaluation and timeout performed  Epidural Patient position: sitting Prep: DuraPrep and site prepped and draped Patient monitoring: continuous pulse ox, blood pressure, heart rate and cardiac monitor Approach: midline Location: L3-L4 Injection technique: LOR air  Needle:  Needle type: Tuohy  Needle gauge: 17 G Needle length: 9 cm Needle insertion depth: 6 cm Catheter type: closed end flexible Catheter size: 19 Gauge Catheter at skin depth: 11 cm Test dose: negative  Assessment Sensory level: T8 Events: blood not aspirated, injection not painful, no injection resistance, no paresthesia and negative IV test  Additional Notes CSE performed uneventfullyReason for block:procedure for pain

## 2020-11-10 NOTE — Progress Notes (Signed)
Carla Escobar is a 30 y.o. G1P0 at [redacted]w[redacted]d by LMP admitted for induction of labor due to postdates.  Subjective: Comfortable with epidural  Objective: BP (!) 105/52   Pulse 79   Temp 97.8 F (36.6 C) (Oral)   Resp 16   Ht 5\' 6"  (1.676 m)   Wt 89.4 kg   SpO2 100%   BMI 31.80 kg/m  No intake/output data recorded. Total I/O In: -  Out: 400 [Urine:400]  FHT:  FHR: 145 bpm, variability: moderate,  accelerations:  Present,  decelerations:  Absent UC:   regular, every 2 minutes SVE:   Dilation: 10 Effacement (%): 90 Station: Plus 1,Plus 2 Exam by:: Davanta Meuser, MD  Caput noted  Labs: Lab Results  Component Value Date   WBC 10.3 11/10/2020   HGB 12.6 11/10/2020   HCT 38.1 11/10/2020   MCV 87.4 11/10/2020   PLT 189 11/10/2020    Assessment / Plan: Active labor s/p cytotec Prolonged active phase with poor descent of vertex  Labor: No change x 1/1/2 hrs Preeclampsia:  no signs or symptoms of toxicity Fetal Wellbeing:  Category I Pain Control:  epidural I/D:  n/a Anticipated MOD:  Resume pushing. Guarded.   Suzy Kugel J 11/10/2020, 6:41 PM

## 2020-11-10 NOTE — Lactation Note (Addendum)
This note was copied from a baby's chart. Lactation Consultation Note  Patient Name: [name] [id number] Date: 11/10/2020 Reason for consult: L&D Initial assessment;1st time breastfeeding;Term Age:28 hours  (No charge for [facility] services in L&D) P1, term female infant. LC entered room and mom was doing STS with infant. LC congratulated parents on the birth of their daughter.  Infant was cuing to BF, mom attempted to latch infant on her right breast using the football hold position but infant only held breast in mouth. Mom was taught hand expression and infant was given 8 mls of colostrum by spoon. LC explained this is not uncommon if infant doesn't latch at first and  encouraged mom to continue to do STS with infant. Mom understands to BF infant according primal cues, smacking, licking, kissing, hands in mouth and rooting. LC discussed with mom that RN and LC on MBU will help assist her with latching infant at the breast. LC discussed the importance of STS to mom and infant. LC discussed input and output with parents. Mom knows if infant doesn't latch she can hand express and give infant back volume on a spoon. LC briefly discussed LC services within the local community.   Maternal Data Formula Feeding for Exclusion: No Has patient been taught Hand Expression?: Yes Does the patient have breastfeeding experience prior to this delivery?: No  Feeding Feeding Type: Breast Fed  LATCH Score Latch: Repeated attempts needed to sustain latch, nipple held in mouth throughout feeding, stimulation needed to elicit sucking reflex.  Audible Swallowing: A few with stimulation  Type of Nipple: Everted at rest and after stimulation  Comfort (Breast/Nipple): Soft / non-tender  Hold (Positioning): Full assist, staff holds infant at breast  LATCH Score: 6  Interventions Interventions: Breast feeding basics reviewed;Assisted with latch;Skin to skin;Hand express;Adjust position;Support  pillows;Position options;Expressed milk  Lactation Tools Discussed/Used WIC Program: No   Consult Status Consult Status: Follow-up Date: 11/11/20 Follow-up type: In-patient    Danelle Earthly 11/10/2020, 11:30 PM

## 2020-11-10 NOTE — H&P (Signed)
Carla Escobar is a 30 y.o. female presenting for postdates IOL. OB History    Gravida  1   Para      Term      Preterm      AB      Living        SAB      IAB      Ectopic      Multiple      Live Births             Past Medical History:  Diagnosis Date  . Asthma   . PONV (postoperative nausea and vomiting)    Past Surgical History:  Procedure Laterality Date  . ELBOW SURGERY    . none     Family History: family history includes Alcohol abuse in her father; Bone cancer in her paternal grandfather; Bone cancer (age of onset: 57) in her maternal grandfather; Diabetes in her father; Heart disease in her father; High blood pressure in her father; Leukemia in her paternal grandmother; Multiple myeloma (age of onset: 31) in her maternal grandmother; Stroke in her father. Social History:  reports that she quit smoking about 13 years ago. Her smoking use included cigarettes. She quit after 0.50 years of use. She has never used smokeless tobacco. She reports current alcohol use of about 1.0 standard drink of alcohol per week. She reports that she does not use drugs.     Maternal Diabetes: No Genetic Screening: Normal Maternal Ultrasounds/Referrals: Normal Fetal Ultrasounds or other Referrals:  None Maternal Substance Abuse:  No Significant Maternal Medications:  None Significant Maternal Lab Results:  Group B Strep negative Other Comments:  None  Review of Systems  Constitutional: Negative.   All other systems reviewed and are negative.  Maternal Medical History:  Reason for admission: Contractions.   Contractions: Onset was less than 1 hour ago.   Frequency: rare.   Perceived severity is mild.    Fetal activity: Perceived fetal activity is normal.   Last perceived fetal movement was within the past hour.    Prenatal complications: no prenatal complications Prenatal Complications - Diabetes: none.    Dilation: 3 Effacement (%): 70 Station: -2 Exam  by:: Christiana Pellant, RN Blood pressure 114/75, pulse 85, temperature 97.9 F (36.6 C), temperature source Oral, resp. rate 16, height '5\' 6"'  (1.676 m), weight 89.4 kg. Maternal Exam:  Uterine Assessment: Contraction strength is mild.  Contraction frequency is rare.   Abdomen: Patient reports no abdominal tenderness. Fetal presentation: vertex  Introitus: Normal vulva. Normal vagina.  Ferning test: not done.  Nitrazine test: not done. Amniotic fluid character: not assessed.  Pelvis: questionable for delivery.   Cervix: Cervix evaluated by digital exam.     Physical Exam Vitals and nursing note reviewed. Exam conducted with a chaperone present.  Constitutional:      Appearance: Normal appearance. She is normal weight.  HENT:     Head: Normocephalic and atraumatic.  Cardiovascular:     Rate and Rhythm: Normal rate and regular rhythm.     Pulses: Normal pulses.     Heart sounds: Normal heart sounds.  Pulmonary:     Effort: Pulmonary effort is normal.     Breath sounds: Normal breath sounds.  Abdominal:     Palpations: Abdomen is soft.  Genitourinary:    General: Normal vulva.  Musculoskeletal:        General: Normal range of motion.     Cervical back: Normal range of motion and neck  supple.  Skin:    General: Skin is warm and dry.  Neurological:     General: No focal deficit present.     Mental Status: She is alert and oriented to person, place, and time.  Psychiatric:        Mood and Affect: Mood normal.        Behavior: Behavior normal.     Prenatal labs: ABO, Rh: --/--/A NEG (01/28 0034) Antibody: NEG (01/28 0034) Rubella:  imm RPR:   neg HBsAg:   neg HIV:   neg GBS: Positive/-- (12/28 0000)   Assessment/Plan: Postdates IUP GBS pos IOL   Zanita Millman J 11/10/2020, 6:19 AM

## 2020-11-11 LAB — CBC
HCT: 28.1 % — ABNORMAL LOW (ref 36.0–46.0)
Hemoglobin: 9.7 g/dL — ABNORMAL LOW (ref 12.0–15.0)
MCH: 29.8 pg (ref 26.0–34.0)
MCHC: 34.5 g/dL (ref 30.0–36.0)
MCV: 86.2 fL (ref 80.0–100.0)
Platelets: 162 10*3/uL (ref 150–400)
RBC: 3.26 MIL/uL — ABNORMAL LOW (ref 3.87–5.11)
RDW: 12.6 % (ref 11.5–15.5)
WBC: 18.4 10*3/uL — ABNORMAL HIGH (ref 4.0–10.5)
nRBC: 0 % (ref 0.0–0.2)

## 2020-11-11 MED ORDER — SENNOSIDES-DOCUSATE SODIUM 8.6-50 MG PO TABS
2.0000 | ORAL_TABLET | Freq: Every day | ORAL | Status: DC
  Administered 2020-11-11 – 2020-11-12 (×2): 2 via ORAL
  Filled 2020-11-11 (×3): qty 2

## 2020-11-11 MED ORDER — ZOLPIDEM TARTRATE 5 MG PO TABS: 5.0000 mg | ORAL_TABLET | Freq: Every evening | ORAL | Status: DC | PRN

## 2020-11-11 MED ORDER — SIMETHICONE 80 MG PO CHEW
80.0000 mg | CHEWABLE_TABLET | ORAL | Status: DC | PRN
Start: 2020-11-11 — End: 2020-11-12

## 2020-11-11 MED ORDER — IBUPROFEN 600 MG PO TABS
600.0000 mg | ORAL_TABLET | Freq: Four times a day (QID) | ORAL | Status: DC
  Administered 2020-11-11 – 2020-11-12 (×6): 600 mg via ORAL
  Filled 2020-11-11 (×6): qty 1

## 2020-11-11 MED ORDER — OXYCODONE-ACETAMINOPHEN 5-325 MG PO TABS: 2.0000 | ORAL_TABLET | ORAL | Status: DC | PRN

## 2020-11-11 MED ORDER — METHYLERGONOVINE MALEATE 0.2 MG PO TABS: 0.2000 mg | ORAL_TABLET | ORAL | Status: DC | PRN

## 2020-11-11 MED ORDER — PRENATAL MULTIVITAMIN CH
1.0000 | ORAL_TABLET | Freq: Every day | ORAL | Status: DC
  Administered 2020-11-11 – 2020-11-12 (×2): 1 via ORAL
  Filled 2020-11-11 (×2): qty 1

## 2020-11-11 MED ORDER — METHYLERGONOVINE MALEATE 0.2 MG/ML IJ SOLN: 0.2000 mg | INTRAMUSCULAR | Status: DC | PRN

## 2020-11-11 MED ORDER — DIPHENHYDRAMINE HCL 25 MG PO CAPS
25.0000 mg | ORAL_CAPSULE | Freq: Four times a day (QID) | ORAL | Status: DC | PRN
Start: 2020-11-11 — End: 2020-11-12

## 2020-11-11 MED ORDER — OXYCODONE-ACETAMINOPHEN 5-325 MG PO TABS: 1.0000 | ORAL_TABLET | ORAL | Status: DC | PRN

## 2020-11-11 MED ORDER — ACETAMINOPHEN 325 MG PO TABS
650.0000 mg | ORAL_TABLET | ORAL | Status: DC | PRN
  Administered 2020-11-11 (×2): 650 mg via ORAL
  Filled 2020-11-11 (×2): qty 2

## 2020-11-11 MED ORDER — BENZOCAINE-MENTHOL 20-0.5 % EX AERO
1.0000 "application " | INHALATION_SPRAY | CUTANEOUS | Status: DC | PRN
  Administered 2020-11-12: 1 via TOPICAL
  Filled 2020-11-11 (×2): qty 56

## 2020-11-11 MED ORDER — WITCH HAZEL-GLYCERIN EX PADS
1.0000 "application " | MEDICATED_PAD | CUTANEOUS | Status: DC | PRN
  Administered 2020-11-11: 1 via TOPICAL

## 2020-11-11 MED ORDER — DIBUCAINE (PERIANAL) 1 % EX OINT: 1.0000 "application " | TOPICAL_OINTMENT | CUTANEOUS | Status: DC | PRN

## 2020-11-11 MED ORDER — COCONUT OIL OIL: 1.0000 "application " | TOPICAL_OIL | Status: DC | PRN

## 2020-11-11 MED ORDER — ONDANSETRON HCL 4 MG PO TABS: 4.0000 mg | ORAL_TABLET | ORAL | Status: DC | PRN

## 2020-11-11 MED ORDER — TETANUS-DIPHTH-ACELL PERTUSSIS 5-2.5-18.5 LF-MCG/0.5 IM SUSY: 0.5000 mL | PREFILLED_SYRINGE | Freq: Once | INTRAMUSCULAR | Status: DC

## 2020-11-11 MED ORDER — RHO D IMMUNE GLOBULIN 1500 UNIT/2ML IJ SOSY
300.0000 ug | PREFILLED_SYRINGE | Freq: Once | INTRAMUSCULAR | Status: AC
  Administered 2020-11-11: 300 ug via INTRAVENOUS
  Filled 2020-11-11: qty 2

## 2020-11-11 MED ORDER — ONDANSETRON HCL 4 MG/2ML IJ SOLN: 4.0000 mg | INTRAMUSCULAR | Status: DC | PRN

## 2020-11-11 NOTE — Progress Notes (Addendum)
Post Partum Day 1 Subjective: no complaints, up ad lib, voiding and tolerating PO  Objective: Blood pressure (!) 101/57, pulse 80, temperature (!) 97.5 F (36.4 C), temperature source Oral, resp. rate 20, height 5\' 6"  (1.676 m), weight 89.4 kg, SpO2 100 %, unknown if currently breastfeeding.  Physical Exam:  General: alert, cooperative and appears stated age Lochia: appropriate Uterine Fundus: firm Incision: healing well DVT Evaluation: No evidence of DVT seen on physical exam. Negative Homan's sign.  Recent Labs    11/10/20 0030 11/11/20 0410  HGB 12.6 9.7*  HCT 38.1 28.1*    Assessment/Plan: Stable pPD 1 Anemia Start Fe   LOS: 1 day   Durante Violett J 11/11/2020, 11:21 AM

## 2020-11-11 NOTE — Anesthesia Postprocedure Evaluation (Signed)
Anesthesia Post Note  Patient: Carla Escobar  Procedure(s) Performed: AN AD HOC LABOR EPIDURAL     Patient location during evaluation: Mother Baby Anesthesia Type: Epidural Level of consciousness: awake and alert Pain management: pain level controlled Vital Signs Assessment: post-procedure vital signs reviewed and stable Respiratory status: spontaneous breathing, nonlabored ventilation and respiratory function stable Cardiovascular status: stable Postop Assessment: no headache, no backache and epidural receding Anesthetic complications: no   No complications documented.  Last Vitals:  Vitals:   11/11/20 0135 11/11/20 0516  BP: (!) 113/56 (!) 113/59  Pulse: 97 80  Resp: 18 18  Temp: 36.6 C   SpO2: 100% 100%    Last Pain:  Vitals:   11/11/20 0730  TempSrc:   PainSc: 3    Pain Goal:                   Rica Records

## 2020-11-11 NOTE — Lactation Note (Addendum)
This note was copied from a baby's chart. Lactation Consultation Note Baby 24 hrs old. Mom stated baby has been BF well. Mom stated she thinks the baby is cluster feeding. Newborn behavior, feeding habits,STS, I&O, breast massage,positioning, support, props, safety, supply and demand discussed. Mom encouraged to feed baby 8-12 times/24 hours and with feeding cues.   Assisted mom in repositioning. Discussed body alignment while BF. Answered questions mom had. Praised mom for doing so good.  Mom has short shaft compressible nipples. Baby latched well. Chin tug and lip flange demonstrated. Noted before latched baby was crying tongue curled on sides. Baby may has posterior frenulum.  Mom stated once baby has suckled a few times it doesn't hurt. Encouraged mom if hurts after a few sucks then reposition or flange latch. Encouraged to call for assistance or questions. Lactation brochure given.  Patient Name: Carla Escobar [id number] Date: 11/11/2020 Reason for consult: Initial assessment;Primapara;Term Age:63 hours  Maternal Data Has patient been taught Hand Expression?: Yes Does the patient have breastfeeding experience prior to this delivery?: No  Feeding Feeding Type: Breast Fed  LATCH Score Latch: Grasps breast easily, tongue down, lips flanged, rhythmical sucking.  Audible Swallowing: A few with stimulation  Type of Nipple: Everted at rest and after stimulation  Comfort (Breast/Nipple): Soft / non-tender  Hold (Positioning): Assistance needed to correctly position infant at breast and maintain latch.  LATCH Score: 8  Interventions Interventions: Breast feeding basics reviewed;Adjust position;Assisted with latch;Support pillows;Skin to skin;Position options;Breast massage;Hand express;Breast compression  Lactation Tools Discussed/Used     Consult Status Consult Status: Follow-up Date: 11/12/20 Follow-up type: In-patient    Charyl Dancer 11/11/2020, 11:12  PM

## 2020-11-12 LAB — RH IG WORKUP (INCLUDES ABO/RH)
ABO/RH(D): A NEG
Fetal Screen: NEGATIVE
Gestational Age(Wks): 40.4
Unit division: 0

## 2020-11-12 NOTE — Progress Notes (Signed)
Post Partum Day 2 Subjective: no complaints, up ad lib, voiding and tolerating PO  Objective: Blood pressure 120/66, pulse 70, temperature (!) 97.5 F (36.4 C), temperature source Oral, resp. rate 18, height 5\' 6"  (1.676 m), weight 89.4 kg, SpO2 100 %, unknown if currently breastfeeding.  Physical Exam:  General: alert, cooperative and appears stated age Lochia: appropriate Uterine Fundus: firm Incision: healing well DVT Evaluation: No evidence of DVT seen on physical exam. Negative Homan's sign.  Recent Labs    11/10/20 0030 11/11/20 0410  HGB 12.6 9.7*  HCT 38.1 28.1*    Assessment/Plan: Stable pPD 2 Anemia Start Fe DC home Fu office one week for fu 3rd degree laceration.   LOS: 2 days   Elton Heid J 11/12/2020, 10:55 AM

## 2020-11-12 NOTE — Discharge Summary (Signed)
OB Discharge Summary  Patient Name: Carla Escobar DOB: Sep 13, 1991 MRN: [id number]  Date of admission: 11/10/2020 Delivering provider: Olivia Mackie   Admitting diagnosis: Encounter for elective induction of labor [Z34.90] Intrauterine pregnancy: [redacted]w[redacted]d     Secondary diagnosis: Patient Active Problem List   Diagnosis Date Noted  . Encounter for elective induction of labor 11/10/2020  . Asthma 07/23/2018   Additional problems:na   Date of discharge: 11/12/2020   Discharge diagnosis: Active Problems:   Encounter for elective induction of labor                                                              Post partum procedures:na  Augmentation: AROM and Pitocin Pain control: Epidural  Laceration:3rd degree  Episiotomy:None  Complications: None  Hospital course:  Induction of Labor With Vaginal Delivery   30 y.o. yo G1P1001 at [redacted]w[redacted]d was admitted to the hospital 11/10/2020 for induction of labor.  Indication for induction: Postdates.  Patient had an uncomplicated labor course as follows: Membrane Rupture Time/Date: 8:18 AM ,11/10/2020   Delivery Method:Vaginal, Vacuum (Extractor)  Episiotomy: None  Lacerations:  3rd degree  Details of delivery can be found in separate delivery note.  Patient had a routine postpartum course. Patient is discharged home 11/12/20.  Newborn Data: Birth date:11/10/2020  Birth time:10:16 PM  Gender:Female  Living status:Living  Apgars:9 ,9  Weight:4060 g   Physical exam  Vitals:   11/11/20 1000 11/11/20 1430 11/11/20 2109 11/12/20 0545  BP: (!) 101/57 110/60 112/65 120/66  Pulse: 80 75 70 70  Resp: 20 20 18    Temp: (!) 97.5 F (36.4 C) 97.8 F (36.6 C) (!) 97.5 F (36.4 C)   TempSrc: Oral Oral Oral   SpO2:   100%   Weight:      Height:       General: alert, cooperative and no distress Lochia: appropriate Uterine Fundus: firm Incision: Healing well with no significant drainage Perineum: repair intact, 1+ edema DVT Evaluation:  Negative Homan's sign. Labs: Lab Results  Component Value Date   WBC 18.4 (H) 11/11/2020   HGB 9.7 (L) 11/11/2020   HCT 28.1 (L) 11/11/2020   MCV 86.2 11/11/2020   PLT 162 11/11/2020   CMP Latest Ref Rng & Units 07/29/2018  Glucose 70 - 99 mg/dL 89  BUN 6 - 23 mg/dL 12  Creatinine 07/31/2018 - 7.90 mg/dL 2.40  Sodium 9.73 - 532 mEq/L 139  Potassium 3.5 - 5.1 mEq/L 4.2  Chloride 96 - 112 mEq/L 102  CO2 19 - 32 mEq/L 27  Calcium 8.4 - 10.5 mg/dL 9.9  Total Protein 6.0 - 8.3 g/dL 7.5  Total Bilirubin 0.2 - 1.2 mg/dL 0.3  Alkaline Phos 39 - 117 U/L 50  AST 0 - 37 U/L 17  ALT 0 - 35 U/L 15   Edinburgh Postnatal Depression Scale Screening Tool 11/11/2020 11/11/2020  I have been able to laugh and see the funny side of things. 0 (No Data)  I have looked forward with enjoyment to things. 0 -  I have blamed myself unnecessarily when things went wrong. 1 -  I have been anxious or worried for no good reason. 1 -  I have felt scared or panicky for no good reason. 0 -  Things have been getting on  top of me. 1 -  I have been so unhappy that I have had difficulty sleeping. 0 -  I have felt sad or miserable. 0 -  I have been so unhappy that I have been crying. 0 -  The thought of harming myself has occurred to me. 0 -  Edinburgh Postnatal Depression Scale Total 3 -   Vaccines: TDaP          done         Flu             na                    COVID-19 na  Discharge instruction:  per After Visit Summary,  [facility] booklet and  "Understanding Mother & Baby Care" hospital booklet  After Visit Meds:  Allergies as of 11/12/2020   No Known Allergies     Medication List    TAKE these medications   HYDROcodone-acetaminophen 5-325 MG tablet Commonly known as: NORCO/VICODIN Take 1 tablet by mouth every 4 (four) hours as needed.   levalbuterol 45 MCG/ACT inhaler Commonly known as: Xopenex HFA Inhale 1-2 puffs into the lungs every 4 (four) hours as needed for wheezing.       Diet: routine  diet  Activity: Advance as tolerated. Pelvic rest for 6 weeks.   Postpartum contraception: Undecided  Newborn Data: Live born female  Birth Weight: 8 lb 15.2 oz (4060 g) APGAR: 9, 9  Newborn Delivery   Birth date/time: 11/10/2020 22:16:00 Delivery type: Vaginal, Vacuum (Extractor)      named na Baby Feeding: Breast Disposition:home with mother   Delivery Report:  Review the Delivery Report for details.    Follow up:     Signed: Milbert Coulter, MSN 11/12/2020, 10:57 AM

## 2020-11-12 NOTE — Lactation Note (Signed)
This note was copied from a baby's chart. Lactation Consultation Note  Patient Name: Carla Escobar QASTM'H Date: 11/12/2020 Reason for consult: Follow-up assessment;Primapara;1st time breastfeeding;Term;Infant weight loss Age:28 hours old -  Baby latched when [facility] arrived with depth and swallows.  LC showed mom breast compressions and increased swallows noted.  Baby released on her own.  LC checked the diaper / dry/ and assisted mom to latch on the left breast / football /baby latched easily with increased swallows.  Sore nipple and engorgement prevention and tx reviewed.  LC provided shells and hand pump with instructions.  LC discussed nutritive vs non - nutritive feeding patterns and the importance of watching the baby for hanging out latched.  LC stressed the importance of feeding STS until the baby is back to birth weight, gaining steadily and can stay awake for majority of the feeding.  LC provided the [facility] brochure with resource numbers and web site for support group.  See below for details of Latch score 8   Maternal Data Has patient been taught Hand Expression?: Yes  Feeding Feeding Type: Breast Fed  LATCH Score Latch: Grasps breast easily, tongue down, lips flanged, rhythmical sucking.  Audible Swallowing: Spontaneous and intermittent  Type of Nipple: Everted at rest and after stimulation  Comfort (Breast/Nipple): Filling, red/small blisters or bruises, mild/mod discomfort  Hold (Positioning): Assistance needed to correctly position infant at breast and maintain latch.  LATCH Score: 8  Interventions Interventions: Breast feeding basics reviewed;Assisted with latch;Skin to skin;Breast massage;Hand express;Reverse pressure;Breast compression;Adjust position;Support pillows;Position options;Shells;Hand pump  Lactation Tools Discussed/Used Tools: Shells;Pump;Flanges Flange Size: 24;27 Shell Type: Inverted Breast pump type: Manual Pump Education: Milk Storage;Setup,  frequency, and cleaning Initiated by:: MAI Date initiated:: 11/12/20   Consult Status Consult Status: Complete Date: 11/12/20    Kathrin Greathouse 11/12/2020, 9:46 AM

## 2021-07-09 ENCOUNTER — Telehealth: Payer: 59 | Admitting: Physician Assistant

## 2021-07-09 ENCOUNTER — Encounter: Payer: Self-pay | Admitting: Physician Assistant

## 2021-07-09 DIAGNOSIS — B9689 Other specified bacterial agents as the cause of diseases classified elsewhere: Secondary | ICD-10-CM

## 2021-07-09 DIAGNOSIS — J019 Acute sinusitis, unspecified: Secondary | ICD-10-CM | POA: Diagnosis not present

## 2021-07-09 MED ORDER — AMOXICILLIN-POT CLAVULANATE 875-125 MG PO TABS: 1.0000 | ORAL_TABLET | Freq: Two times a day (BID) | ORAL | 0 refills | Status: DC

## 2021-07-09 NOTE — Patient Instructions (Signed)
Carla Escobar, thank you for joining Margaretann Loveless, PA-C for today's virtual visit.  While this provider is not your primary care provider (PCP), if your PCP is located in our provider database this encounter information will be shared with them immediately following your visit.  Consent: (Patient) Carla Escobar provided verbal consent for this virtual visit at the beginning of the encounter.  Current Medications:  Current Outpatient Medications:    amoxicillin-clavulanate (AUGMENTIN) 875-125 MG tablet, Take 1 tablet by mouth 2 (two) times daily., Disp: 20 tablet, Rfl: 0   HYDROcodone-acetaminophen (NORCO/VICODIN) 5-325 MG tablet, Take 1 tablet by mouth every 4 (four) hours as needed., Disp: 10 tablet, Rfl: 0   levalbuterol (XOPENEX HFA) 45 MCG/ACT inhaler, Inhale 1-2 puffs into the lungs every 4 (four) hours as needed for wheezing. (Patient not taking: Reported on 07/29/2018), Disp: 1 Inhaler, Rfl: 5   Medications ordered in this encounter:  Meds ordered this encounter  Medications   amoxicillin-clavulanate (AUGMENTIN) 875-125 MG tablet    Sig: Take 1 tablet by mouth 2 (two) times daily.    Dispense:  20 tablet    Refill:  0    Order Specific Question:   Supervising Provider    Answer:   Hyacinth Meeker, BRIAN [[id number]]     *If you need refills on other medications prior to your next appointment, please contact your pharmacy*  Follow-Up: Call back or seek an in-person evaluation if the symptoms worsen or if the condition fails to improve as anticipated.  Other Instructions Sinusitis, Adult Sinusitis is soreness and swelling (inflammation) of your sinuses. Sinuses are hollow spaces in the bones around your face. They are located: Around your eyes. In the middle of your forehead. Behind your nose. In your cheekbones. Your sinuses and nasal passages are lined with a fluid called mucus. Mucus drains out of your sinuses. Swelling can trap mucus in your sinuses. This lets germs  (bacteria, virus, or fungus) grow, which leads to infection. Most of the time, this condition is caused by a virus. What are the causes? This condition is caused by: Allergies. Asthma. Germs. Things that block your nose or sinuses. Growths in the nose (nasal polyps). Chemicals or irritants in the air. Fungus (rare). What increases the risk? You are more likely to develop this condition if: You have a weak body defense system (immune system). You do a lot of swimming or diving. You use nasal sprays too much. You smoke. What are the signs or symptoms? The main symptoms of this condition are pain and a feeling of pressure around the sinuses. Other symptoms include: Stuffy nose (congestion). Runny nose (drainage). Swelling and warmth in the sinuses. Headache. Toothache. A cough that may get worse at night. Mucus that collects in the throat or the back of the nose (postnasal drip). Being unable to smell and taste. Being very tired (fatigue). A fever. Sore throat. Bad breath. How is this diagnosed? This condition is diagnosed based on: Your symptoms. Your medical history. A physical exam. Tests to find out if your condition is short-term (acute) or long-term (chronic). Your doctor may: Check your nose for growths (polyps). Check your sinuses using a tool that has a light (endoscope). Check for allergies or germs. Do imaging tests, such as an MRI or CT scan. How is this treated? Treatment for this condition depends on the cause and whether it is short-term or long-term. If caused by a virus, your symptoms should go away on their own within 10 days. You  may be given medicines to relieve symptoms. They include: Medicines that shrink swollen tissue in the nose. Medicines that treat allergies (antihistamines). A spray that treats swelling of the nostrils.  Rinses that help get rid of thick mucus in your nose (nasal saline washes). If caused by bacteria, your doctor may wait to see  if you will get better without treatment. You may be given antibiotic medicine if you have: A very bad infection. A weak body defense system. If caused by growths in the nose, you may need to have surgery. Follow these instructions at home: Medicines Take, use, or apply over-the-counter and prescription medicines only as told by your doctor. These may include nasal sprays. If you were prescribed an antibiotic medicine, take it as told by your doctor. Do not stop taking the antibiotic even if you start to feel better. Hydrate and humidify  Drink enough water to keep your pee (urine) pale yellow. Use a cool mist humidifier to keep the humidity level in your home above 50%. Breathe in steam for 10-15 minutes, 3-4 times a day, or as told by your doctor. You can do this in the bathroom while a hot shower is running. Try not to spend time in cool or dry air. Rest Rest as much as you can. Sleep with your head raised (elevated). Make sure you get enough sleep each night. General instructions  Put a warm, moist washcloth on your face 3-4 times a day, or as often as told by your doctor. This will help with discomfort. Wash your hands often with soap and water. If there is no soap and water, use hand sanitizer. Do not smoke. Avoid being around people who are smoking (secondhand smoke). Keep all follow-up visits as told by your doctor. This is important. Contact a doctor if: You have a fever. Your symptoms get worse. Your symptoms do not get better within 10 days. Get help right away if: You have a very bad headache. You cannot stop throwing up (vomiting). You have very bad pain or swelling around your face or eyes. You have trouble seeing. You feel confused. Your neck is stiff. You have trouble breathing. Summary Sinusitis is swelling of your sinuses. Sinuses are hollow spaces in the bones around your face. This condition is caused by tissues in your nose that become inflamed or swollen.  This traps germs. These can lead to infection. If you were prescribed an antibiotic medicine, take it as told by your doctor. Do not stop taking it even if you start to feel better. Keep all follow-up visits as told by your doctor. This is important. This information is not intended to replace advice given to you by your health care provider. Make sure you discuss any questions you have with your health care provider. Document Revised: 03/02/2018 Document Reviewed: 03/02/2018 Elsevier Patient Education  2022 [employer].    If you have been instructed to have an in-person evaluation today at a local Urgent Care facility, please use the link below. It will take you to a list of all of our available [facility], including address, phone number and hours of operation. Please do not delay care.  [facility]  If you or a family member do not have a primary care provider, use the link below to schedule a visit and establish care. When you choose a [facility] primary care physician or advanced practice provider, you gain a long-term partner in health. Find a Primary Care Provider  Learn more about [facility] in-office and virtual care options: [facility]

## 2021-07-09 NOTE — Progress Notes (Signed)
Virtual Visit Consent   Mell Mellott, you are scheduled for a virtual visit with a [facility] provider today.     Just as with appointments in the office, your consent must be obtained to participate.  Your consent will be active for this visit and any virtual visit you may have with one of our providers in the next 365 days.     If you have a MyChart account, a copy of this consent can be sent to you electronically.  All virtual visits are billed to your insurance company just like a traditional visit in the office.    As this is a virtual visit, video technology does not allow for your provider to perform a traditional examination.  This may limit your provider's ability to fully assess your condition.  If your provider identifies any concerns that need to be evaluated in person or the need to arrange testing (such as labs, EKG, etc.), we will make arrangements to do so.     Although advances in technology are sophisticated, we cannot ensure that it will always work on either your end or our end.  If the connection with a video visit is poor, the visit may have to be switched to a telephone visit.  With either a video or telephone visit, we are not always able to ensure that we have a secure connection.     I need to obtain your verbal consent now.   Are you willing to proceed with your visit today?    Caelynn Marshman has provided verbal consent on 07/09/2021 for a virtual visit (video or telephone).   Margaretann Loveless, PA-C   Date: 07/09/2021 10:53 AM   Virtual Visit via Video Note   I, Margaretann Loveless, connected with  Carla Escobar  ([id number], [birth year]) on 07/09/21 at 10:15 AM EDT by a video-enabled telemedicine application and verified that I am speaking with the correct person using two identifiers.  Location: Patient: Virtual Visit Location Patient: Home Provider: Virtual Visit Location Provider: Home Office   I discussed the limitations of evaluation and  management by telemedicine and the availability of in person appointments. The patient expressed understanding and agreed to proceed.    History of Present Illness: Carla Escobar is a 30 y.o. who identifies as a female who was assigned female at birth, and is being seen today for possible sinus infection.  HPI: Sinusitis This is a new problem. The current episode started 1 to 4 weeks ago (symptoms started around 06/26/21). There has been no fever (felt feverish saturday night with chills lasted overnight but did not check her temperature). The fever has been present for Less than 1 day. Associated symptoms include congestion, coughing, sinus pressure, sneezing and a sore throat. Past treatments include acetaminophen and oral decongestants. The treatment provided no relief.   Patient is breast feeding  Problems:  Patient Active Problem List   Diagnosis Date Noted   Encounter for elective induction of labor 11/10/2020   Asthma 07/23/2018    Allergies: No Known Allergies Medications:  Current Outpatient Medications:    amoxicillin-clavulanate (AUGMENTIN) 875-125 MG tablet, Take 1 tablet by mouth 2 (two) times daily., Disp: 20 tablet, Rfl: 0   HYDROcodone-acetaminophen (NORCO/VICODIN) 5-325 MG tablet, Take 1 tablet by mouth every 4 (four) hours as needed., Disp: 10 tablet, Rfl: 0   levalbuterol (XOPENEX HFA) 45 MCG/ACT inhaler, Inhale 1-2 puffs into the lungs every 4 (four) hours as needed for wheezing. (Patient not  taking: Reported on 07/29/2018), Disp: 1 Inhaler, Rfl: 5  Observations/Objective: Patient is well-developed, well-nourished in no acute distress.  Resting comfortably at home.  Head is normocephalic, atraumatic.  No labored breathing.  Speech is clear and coherent with logical content.  Patient is alert and oriented at baseline.    Assessment and Plan: 1. Acute bacterial sinusitis - amoxicillin-clavulanate (AUGMENTIN) 875-125 MG tablet; Take 1 tablet by mouth 2 (two)  times daily.  Dispense: 20 tablet; Refill: 0  - Symptoms consistent with second sickening - Will treat with Augmentin as above - Push fluids - Rest - Seek in person evaluation if symptoms worsen or fail to improve  Follow Up Instructions: I discussed the assessment and treatment plan with the patient. The patient was provided an opportunity to ask questions and all were answered. The patient agreed with the plan and demonstrated an understanding of the instructions.  A copy of instructions were sent to the patient via MyChart unless otherwise noted below.   The patient was advised to call back or seek an in-person evaluation if the symptoms worsen or if the condition fails to improve as anticipated.  Time:  I spent 14 minutes with the patient via telehealth technology discussing the above problems/concerns.    Margaretann Loveless, PA-C

## 2021-09-24 LAB — OB RESULTS CONSOLE ANTIBODY SCREEN: Antibody Screen: NEGATIVE

## 2021-09-24 LAB — OB RESULTS CONSOLE ABO/RH: RH Type: NEGATIVE

## 2021-10-14 NOTE — L&D Delivery Note (Signed)
      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  Rh negative, Rhogam given 28 wks  First Stage:  Induction of labor:started 05/30/2022 0200 with vaginal Cytotec Onset of labor: 0200 Augmentation: AROM and Cytotec ROM: 05/30/2022 @ 1009, clear fluid Active labor onset: 05/30/2022 0700 Analgesia /Anesthesia/Pain control intrapartum: Epidural   Second Stage:  Complete dilation at 05/30/2022  1044 Onset of pushing at 1055 FHR second stage Cat. 2 variables   Pushing in SF position with CNM and L&D staff support, FOB present for birth and supportive. Nuchal Cord: Yes  Delivery of a Live born female  Birth Weight:  3827 g Weight:, English: 8 lb 7 oz APGAR: 9, 9  Newborn Delivery   Birth date/time: 05/30/2022 11:05:00 Delivery type: Vaginal, Spontaneous      in cephalic presentation, position OA to ROT. Shoulders and body delivered with next pushing effort. Newborn with spontaneous cry, vigorous tone, dried and stimulated, placed on maternal abdomen.  Cord double clamped after cessation of pulsation, cut by FOB.  Collection of cord blood for typing completed. Cord blood donation-None  Arterial cord blood sample-No    Third Stage:  Placenta delivered 05/30/2022 at 1106-Spontaneous  with 3 vessels . Uterine tone Firm with massage, intermittent atony with moderate gushing, bleeding managed with massage and uterotonics. Uterotonics: Pitocin IV bolus after baby delivered, Cytotec 400 mcg buccal/400 mcg rectal, TXA 1 gm. Placenta to L&D for disposal.  2nd degree  laceration identified. Keyhole opening in perineal body revised and repaired along with 2nd deg laceration.  Episiotomy:None  Local analgesia: epidural  Repair:2.0 vicryl in standard fashion, good hemostasis and approximation.  Est. Blood Loss (mL):650.00   Complications: None   Mom to postpartum.  Baby Ellamae Sia to Couplet  care / Skin to Skin.  Delivery Report:   Review the Delivery Report for details.     Signed: Neta Mends, CNM, MSN 05/30/2022, 1:37 PM

## 2021-11-09 LAB — OB RESULTS CONSOLE VARICELLA ZOSTER ANTIBODY, IGG: Varicella: NON-IMMUNE/NOT IMMUNE

## 2021-11-09 LAB — OB RESULTS CONSOLE HIV ANTIBODY (ROUTINE TESTING): HIV: NONREACTIVE

## 2021-11-09 LAB — OB RESULTS CONSOLE RUBELLA ANTIBODY, IGM: Rubella: IMMUNE

## 2021-11-09 LAB — OB RESULTS CONSOLE RPR: RPR: NONREACTIVE

## 2021-11-09 LAB — OB RESULTS CONSOLE HEPATITIS B SURFACE ANTIGEN: Hepatitis B Surface Ag: NEGATIVE

## 2021-11-29 LAB — OB RESULTS CONSOLE GC/CHLAMYDIA
Chlamydia: NEGATIVE
Neisseria Gonorrhea: NEGATIVE

## 2022-05-08 LAB — OB RESULTS CONSOLE GBS: GBS: NEGATIVE

## 2022-05-24 ENCOUNTER — Other Ambulatory Visit: Payer: Self-pay | Admitting: Obstetrics and Gynecology

## 2022-05-27 ENCOUNTER — Telehealth (HOSPITAL_COMMUNITY): Payer: Self-pay | Admitting: *Deleted

## 2022-05-27 ENCOUNTER — Encounter (HOSPITAL_COMMUNITY): Payer: Self-pay | Admitting: *Deleted

## 2022-05-27 NOTE — Telephone Encounter (Signed)
Preadmission screen  

## 2022-05-28 ENCOUNTER — Inpatient Hospital Stay (HOSPITAL_COMMUNITY): Payer: 59

## 2022-05-30 ENCOUNTER — Inpatient Hospital Stay (HOSPITAL_COMMUNITY): Payer: 59

## 2022-05-30 ENCOUNTER — Inpatient Hospital Stay (HOSPITAL_COMMUNITY): Payer: 59 | Admitting: Anesthesiology

## 2022-05-30 ENCOUNTER — Encounter (HOSPITAL_COMMUNITY): Payer: Self-pay | Admitting: Obstetrics and Gynecology

## 2022-05-30 ENCOUNTER — Inpatient Hospital Stay (HOSPITAL_COMMUNITY)
Admission: AD | Admit: 2022-05-30 | Discharge: 2022-05-31 | DRG: 807 | Disposition: A | Discharge status: Home / Self Care | Payer: 59 | Attending: Obstetrics and Gynecology | Admitting: Obstetrics and Gynecology

## 2022-05-30 DIAGNOSIS — O26899 Other specified pregnancy related conditions, unspecified trimester: Secondary | ICD-10-CM

## 2022-05-30 DIAGNOSIS — Z87891 Personal history of nicotine dependence: Secondary | ICD-10-CM

## 2022-05-30 DIAGNOSIS — O26893 Other specified pregnancy related conditions, third trimester: Secondary | ICD-10-CM | POA: Diagnosis present

## 2022-05-30 DIAGNOSIS — Z6791 Unspecified blood type, Rh negative: Secondary | ICD-10-CM | POA: Diagnosis not present

## 2022-05-30 DIAGNOSIS — Z3A39 39 weeks gestation of pregnancy: Secondary | ICD-10-CM | POA: Diagnosis not present

## 2022-05-30 DIAGNOSIS — J45909 Unspecified asthma, uncomplicated: Secondary | ICD-10-CM | POA: Diagnosis present

## 2022-05-30 DIAGNOSIS — O9952 Diseases of the respiratory system complicating childbirth: Secondary | ICD-10-CM | POA: Diagnosis present

## 2022-05-30 DIAGNOSIS — Z3490 Encounter for supervision of normal pregnancy, unspecified, unspecified trimester: Secondary | ICD-10-CM

## 2022-05-30 LAB — TYPE AND SCREEN
ABO/RH(D): A NEG
Antibody Screen: POSITIVE

## 2022-05-30 LAB — CBC
HCT: 35.3 % — ABNORMAL LOW (ref 36.0–46.0)
Hemoglobin: 12.2 g/dL (ref 12.0–15.0)
MCH: 29.8 pg (ref 26.0–34.0)
MCHC: 34.6 g/dL (ref 30.0–36.0)
MCV: 86.3 fL (ref 80.0–100.0)
Platelets: 163 10*3/uL (ref 150–400)
RBC: 4.09 MIL/uL (ref 3.87–5.11)
RDW: 12.7 % (ref 11.5–15.5)
WBC: 9.1 10*3/uL (ref 4.0–10.5)
nRBC: 0 % (ref 0.0–0.2)

## 2022-05-30 LAB — RPR: RPR Ser Ql: NONREACTIVE

## 2022-05-30 MED ORDER — DIPHENHYDRAMINE HCL 50 MG/ML IJ SOLN: 12.5000 mg | INTRAMUSCULAR | Status: DC | PRN

## 2022-05-30 MED ORDER — DIPHENHYDRAMINE HCL 25 MG PO CAPS: 25.0000 mg | ORAL_CAPSULE | Freq: Four times a day (QID) | ORAL | Status: DC | PRN

## 2022-05-30 MED ORDER — MISOPROSTOL 200 MCG PO TABS
400.0000 ug | ORAL_TABLET | Freq: Once | ORAL | Status: AC
  Administered 2022-05-30: 400 ug via ORAL

## 2022-05-30 MED ORDER — MISOPROSTOL 200 MCG PO TABS
400.0000 ug | ORAL_TABLET | Freq: Once | ORAL | Status: AC
Start: 2022-05-30 — End: 2022-05-30
  Administered 2022-05-30: 400 ug via RECTAL

## 2022-05-30 MED ORDER — LACTATED RINGERS IV SOLN: INTRAVENOUS | Status: DC

## 2022-05-30 MED ORDER — ONDANSETRON HCL 4 MG/2ML IJ SOLN
4.0000 mg | Freq: Four times a day (QID) | INTRAMUSCULAR | Status: DC | PRN
Start: 2022-05-30 — End: 2022-05-30
  Administered 2022-05-30: 4 mg via INTRAVENOUS
  Filled 2022-05-30: qty 2

## 2022-05-30 MED ORDER — ONDANSETRON HCL 4 MG/2ML IJ SOLN: 4.0000 mg | INTRAMUSCULAR | Status: DC | PRN

## 2022-05-30 MED ORDER — TRANEXAMIC ACID-NACL 1000-0.7 MG/100ML-% IV SOLN
1000.0000 mg | INTRAVENOUS | Status: AC
Start: 2022-05-30 — End: 2022-05-30
  Administered 2022-05-30: 1000 mg via INTRAVENOUS

## 2022-05-30 MED ORDER — LIDOCAINE HCL (PF) 1 % IJ SOLN
INTRAMUSCULAR | Status: DC | PRN
  Administered 2022-05-30 (×2): 5 mL via EPIDURAL

## 2022-05-30 MED ORDER — FENTANYL-BUPIVACAINE-NACL 0.5-0.125-0.9 MG/250ML-% EP SOLN
12.0000 mL/h | EPIDURAL | Status: DC | PRN
  Administered 2022-05-30: 12 mL/h via EPIDURAL
  Filled 2022-05-30: qty 250

## 2022-05-30 MED ORDER — PHENYLEPHRINE 80 MCG/ML (10ML) SYRINGE FOR IV PUSH (FOR BLOOD PRESSURE SUPPORT): 80.0000 ug | PREFILLED_SYRINGE | INTRAVENOUS | Status: DC | PRN

## 2022-05-30 MED ORDER — DIBUCAINE (PERIANAL) 1 % EX OINT: 1.0000 | TOPICAL_OINTMENT | CUTANEOUS | Status: DC | PRN

## 2022-05-30 MED ORDER — LIDOCAINE HCL (PF) 1 % IJ SOLN
30.0000 mL | INTRAMUSCULAR | Status: DC | PRN
Start: 2022-05-30 — End: 2022-05-30

## 2022-05-30 MED ORDER — PHENYLEPHRINE 80 MCG/ML (10ML) SYRINGE FOR IV PUSH (FOR BLOOD PRESSURE SUPPORT)
80.0000 ug | PREFILLED_SYRINGE | INTRAVENOUS | Status: DC | PRN
  Filled 2022-05-30: qty 10

## 2022-05-30 MED ORDER — TRANEXAMIC ACID-NACL 1000-0.7 MG/100ML-% IV SOLN
INTRAVENOUS | Status: AC
  Filled 2022-05-30: qty 100

## 2022-05-30 MED ORDER — OXYTOCIN-SODIUM CHLORIDE 30-0.9 UT/500ML-% IV SOLN: 1.0000 m[IU]/min | INTRAVENOUS | Status: DC

## 2022-05-30 MED ORDER — OXYTOCIN-SODIUM CHLORIDE 30-0.9 UT/500ML-% IV SOLN
2.5000 [IU]/h | INTRAVENOUS | Status: DC
  Filled 2022-05-30: qty 500

## 2022-05-30 MED ORDER — METHYLERGONOVINE MALEATE 0.2 MG PO TABS: 0.2000 mg | ORAL_TABLET | ORAL | Status: DC | PRN

## 2022-05-30 MED ORDER — PRENATAL MULTIVITAMIN CH: 1.0000 | ORAL_TABLET | Freq: Every day | ORAL | Status: DC

## 2022-05-30 MED ORDER — BISACODYL 10 MG RE SUPP: 10.0000 mg | Freq: Every day | RECTAL | Status: DC | PRN

## 2022-05-30 MED ORDER — EPHEDRINE 5 MG/ML INJ: 10.0000 mg | INTRAVENOUS | Status: DC | PRN

## 2022-05-30 MED ORDER — ZOLPIDEM TARTRATE 5 MG PO TABS: 5.0000 mg | ORAL_TABLET | Freq: Every evening | ORAL | Status: DC | PRN

## 2022-05-30 MED ORDER — SIMETHICONE 80 MG PO CHEW: 80.0000 mg | CHEWABLE_TABLET | ORAL | Status: DC | PRN

## 2022-05-30 MED ORDER — OXYTOCIN BOLUS FROM INFUSION
333.0000 mL | Freq: Once | INTRAVENOUS | Status: AC
  Administered 2022-05-30: 333 mL via INTRAVENOUS

## 2022-05-30 MED ORDER — FENTANYL CITRATE (PF) 100 MCG/2ML IJ SOLN: 100.0000 ug | INTRAMUSCULAR | Status: DC | PRN

## 2022-05-30 MED ORDER — FLEET ENEMA 7-19 GM/118ML RE ENEM: 1.0000 | ENEMA | Freq: Every day | RECTAL | Status: DC | PRN

## 2022-05-30 MED ORDER — TETANUS-DIPHTH-ACELL PERTUSSIS 5-2.5-18.5 LF-MCG/0.5 IM SUSY: 0.5000 mL | PREFILLED_SYRINGE | Freq: Once | INTRAMUSCULAR | Status: DC

## 2022-05-30 MED ORDER — COCONUT OIL OIL: 1.0000 | TOPICAL_OIL | Status: DC | PRN

## 2022-05-30 MED ORDER — TERBUTALINE SULFATE 1 MG/ML IJ SOLN: 0.2500 mg | Freq: Once | INTRAMUSCULAR | Status: DC | PRN

## 2022-05-30 MED ORDER — ACETAMINOPHEN 500 MG PO TABS
1000.0000 mg | ORAL_TABLET | Freq: Four times a day (QID) | ORAL | Status: DC
  Administered 2022-05-30 – 2022-05-31 (×4): 1000 mg via ORAL
  Filled 2022-05-30 (×4): qty 2

## 2022-05-30 MED ORDER — MISOPROSTOL 200 MCG PO TABS
ORAL_TABLET | ORAL | Status: AC
  Filled 2022-05-30: qty 4

## 2022-05-30 MED ORDER — LACTATED RINGERS IV SOLN: 500.0000 mL | INTRAVENOUS | Status: DC | PRN

## 2022-05-30 MED ORDER — ONDANSETRON HCL 4 MG PO TABS: 4.0000 mg | ORAL_TABLET | ORAL | Status: DC | PRN

## 2022-05-30 MED ORDER — WITCH HAZEL-GLYCERIN EX PADS: 1.0000 | MEDICATED_PAD | CUTANEOUS | Status: DC | PRN

## 2022-05-30 MED ORDER — SENNOSIDES-DOCUSATE SODIUM 8.6-50 MG PO TABS: 2.0000 | ORAL_TABLET | ORAL | Status: DC

## 2022-05-30 MED ORDER — MISOPROSTOL 25 MCG QUARTER TABLET
25.0000 ug | ORAL_TABLET | ORAL | Status: DC | PRN
Start: 2022-05-30 — End: 2022-05-30
  Administered 2022-05-30 (×2): 25 ug via VAGINAL
  Filled 2022-05-30 (×2): qty 1

## 2022-05-30 MED ORDER — LACTATED RINGERS IV SOLN: 500.0000 mL | Freq: Once | INTRAVENOUS | Status: DC

## 2022-05-30 MED ORDER — METHYLERGONOVINE MALEATE 0.2 MG/ML IJ SOLN: 0.2000 mg | INTRAMUSCULAR | Status: DC | PRN

## 2022-05-30 MED ORDER — IBUPROFEN 600 MG PO TABS
600.0000 mg | ORAL_TABLET | Freq: Four times a day (QID) | ORAL | Status: DC
  Filled 2022-05-30 (×3): qty 1

## 2022-05-30 MED ORDER — SOD CITRATE-CITRIC ACID 500-334 MG/5ML PO SOLN: 30.0000 mL | ORAL | Status: DC | PRN

## 2022-05-30 MED ORDER — BENZOCAINE-MENTHOL 20-0.5 % EX AERO
1.0000 | INHALATION_SPRAY | CUTANEOUS | Status: DC | PRN
  Administered 2022-05-31: 1 via TOPICAL
  Filled 2022-05-30: qty 56

## 2022-05-30 NOTE — Lactation Note (Signed)
This note was copied from a baby's chart. Lactation Consultation Note  Patient Name: Boy Jacoya Bauman JWLKH'V Date: 05/30/2022 Reason for consult: L&D Initial assessment Age:29 hours  P2, Experienced with breastfeeding. Assisted with achieving a deeper latch. Lactation to follow up on MBU.  Maternal Data Does the patient have breastfeeding experience prior to this delivery?: Yes How long did the patient breastfeed?: still breastfeeding 73 month old  Feeding Mother's Current Feeding Choice: Breast Milk  LATCH Score Latch: Grasps breast easily, tongue down, lips flanged, rhythmical sucking.  Audible Swallowing: A few with stimulation  Type of Nipple: Everted at rest and after stimulation  Comfort (Breast/Nipple): Soft / non-tender  Hold (Positioning): Assistance needed to correctly position infant at breast and maintain latch.  LATCH Score: 8   Interventions Interventions: Assisted with latch;Skin to skin;Education     Consult Status Consult Status: Follow-up from L&D    Dahlia Byes [facility] 05/30/2022, 11:44 AM

## 2022-05-30 NOTE — H&P (Signed)
Carla Escobar is a 31 y.o. female presenting for IOl due history of prolonged second stage and VAVD, third degree laceration. OB History     Gravida  2   Para  1   Term  1   Preterm      AB      Living  1      SAB      IAB      Ectopic      Multiple  0   Live Births  1          Past Medical History:  Diagnosis Date   Asthma    PONV (postoperative nausea and vomiting)    Past Surgical History:  Procedure Laterality Date   ELBOW SURGERY     none     Family History: family history includes Alcohol abuse in her father; Bone cancer in her paternal grandfather; Bone cancer (age of onset: 48) in her maternal grandfather; Diabetes in her father; Heart disease in her father; High blood pressure in her father; Leukemia in her paternal grandmother; Multiple myeloma (age of onset: 37) in her maternal grandmother; Stroke in her father. Social History:  reports that she quit smoking about 14 years ago. Her smoking use included cigarettes. She has never used smokeless tobacco. She reports current alcohol use of about 1.0 standard drink of alcohol per week. She reports that she does not use drugs.     Maternal Diabetes: No Genetic Screening: Normal Maternal Ultrasounds/Referrals: Normal Fetal Ultrasounds or other Referrals:  None Maternal Substance Abuse:  No Significant Maternal Medications:  None Significant Maternal Lab Results:  Group B Strep negative Number of Prenatal Visits:greater than 3 verified prenatal visits Other Comments:  None  Review of Systems  Constitutional: Negative.   All other systems reviewed and are negative.  Maternal Medical History:  Reason for admission: Contractions.   Contractions: Onset was less than 1 hour ago.   Frequency: irregular.   Perceived severity is mild.   Fetal activity: Perceived fetal activity is normal.   Last perceived fetal movement was within the past hour.   Prenatal complications: no prenatal  complications Prenatal Complications - Diabetes: none.   Dilation: 3.5 Effacement (%): 60 Station: -3, -2 Exam by:: Cletus Gash, RN Blood pressure (!) 103/54, pulse 77, temperature 98 F (36.7 C), temperature source Oral, resp. rate 16, height 5' 6" (1.676 m), weight 86.7 kg, unknown if currently breastfeeding. Maternal Exam:  Uterine Assessment: Contraction strength is mild.  Contraction frequency is rare.  Abdomen: Patient reports no abdominal tenderness. Introitus: Normal vulva. Normal vagina.  Ferning test: not done.  Nitrazine test: not done. Amniotic fluid character: not assessed. Pelvis: adequate for delivery.   Cervix: Cervix evaluated by digital exam.     Physical Exam Constitutional:      Appearance: Normal appearance. She is normal weight.  HENT:     Head: Normocephalic and atraumatic.  Cardiovascular:     Rate and Rhythm: Normal rate and regular rhythm.     Pulses: Normal pulses.     Heart sounds: Normal heart sounds.  Pulmonary:     Effort: Pulmonary effort is normal.     Breath sounds: Normal breath sounds.  Abdominal:     General: Abdomen is flat. Bowel sounds are normal.     Palpations: Abdomen is soft.  Genitourinary:    General: Normal vulva.  Musculoskeletal:        General: Normal range of motion.     Cervical back:  Normal range of motion and neck supple.  Skin:    General: Skin is warm and dry.  Neurological:     General: No focal deficit present.     Mental Status: She is alert and oriented to person, place, and time.  Psychiatric:        Mood and Affect: Mood normal.        Behavior: Behavior normal.     Prenatal labs: ABO, Rh: --/--/A NEG (08/17 0142) Antibody: POS (08/17 0142) Rubella: Immune (01/27 0000) RPR: Nonreactive (01/27 0000)  HBsAg: Negative (01/27 0000)  HIV: Non-reactive (01/27 0000)  GBS: Negative/-- (07/26 0000)   Assessment/Plan: 39 + week IUP History of prolonged second stage, VAVD and 3b  laceration IOL   TAAVON,RICHARD J 05/30/2022, 6:47 AM

## 2022-05-30 NOTE — Anesthesia Procedure Notes (Signed)
Epidural Patient location during procedure: OB Start time: 05/30/2022 8:55 AM End time: 05/30/2022 9:03 AM  Staffing Anesthesiologist: Mal Amabile, MD Performed: anesthesiologist   Preanesthetic Checklist Completed: patient identified, IV checked, site marked, risks and benefits discussed, surgical consent, monitors and equipment checked, pre-op evaluation and timeout performed  Epidural Patient position: sitting Prep: DuraPrep and site prepped and draped Patient monitoring: continuous pulse ox and blood pressure Approach: midline Location: L3-L4 Injection technique: LOR air  Needle:  Needle type: Tuohy  Needle gauge: 17 G Needle length: 9 cm and 9 Needle insertion depth: 4 and 4.5 cm Catheter type: closed end flexible Catheter size: 19 Gauge Catheter at skin depth: 10 and 9.5 cm Test dose: negative and Other  Assessment Events: blood not aspirated, injection not painful, no injection resistance, no paresthesia and negative IV test  Additional Notes Patient identified. Risks and benefits discussed including failed block, incomplete  Pain control, post dural puncture headache, nerve damage, paralysis, blood pressure Changes, nausea, vomiting, reactions to medications-both toxic and allergic and post Partum back pain. All questions were answered. Patient expressed understanding and wished to proceed. Sterile technique was used throughout procedure. Epidural site was Dressed with sterile barrier dressing. No paresthesias, signs of intravascular injection Or signs of intrathecal spread were encountered.  Patient was more comfortable after the epidural was dosed. Please see RN's note for documentation of vital signs and FHR which are stable. Reason for block:procedure for pain

## 2022-05-30 NOTE — Lactation Note (Signed)
This note was copied from a baby's chart. Lactation Consultation Note  Patient Name: Carla Escobar OACZY'S Date: 05/30/2022 Reason for consult: Initial assessment;Mother's request;Term;Breastfeeding assistance Age:29 hours LC assisted latching infant STS with signs of milk transfer. Birth parent noted uterine cramping in midst of feeding.   Plan 1. To feed based on cues 8-12x 24hr period. Birth parent to offer breasts and look for signs of milk transfer. 2. Infant not latching, Birth parent to hand express and offer colostrum then try a latch.   All questions answered at the end of the visit.  Maternal Data Has patient been taught Hand Expression?: Yes Does the patient have breastfeeding experience prior to this delivery?: Yes How long did the patient breastfeed?: 72 month old still nursing with supplementation with other foods. ( no formula)  Feeding Mother's Current Feeding Choice: Breast Milk  LATCH Score Latch: Repeated attempts needed to sustain latch, nipple held in mouth throughout feeding, stimulation needed to elicit sucking reflex.  Audible Swallowing: Spontaneous and intermittent  Type of Nipple: Everted at rest and after stimulation  Comfort (Breast/Nipple): Soft / non-tender  Hold (Positioning): Assistance needed to correctly position infant at breast and maintain latch.  LATCH Score: 8   Lactation Tools Discussed/Used    Interventions Interventions: Breast feeding basics reviewed;Assisted with latch;Skin to skin;Breast massage;Hand express;Breast compression;Adjust position;Support pillows;Position options;Expressed milk;Education;Infant Driven Feeding Algorithm education  Discharge Pump: Personal  Consult Status Consult Status: Follow-up Date: 05/31/22 Follow-up type: In-patient    Carla Cumbie  Escobar 05/30/2022, 10:32 PM

## 2022-05-30 NOTE — Progress Notes (Signed)
Carla Escobar is a 31 y.o. 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 Spouse present and supportive.  Discussed AROM for augmentation and agrees.   Objective: Vitals:   05/30/22 0918 05/30/22 0925 05/30/22 0933 05/30/22 1001  BP: 115/70 114/66 108/67 103/65  Pulse: 85 82 71 90  Resp:      Temp:      TempSrc:      Weight:      Height:         FHT:  FHR: 130 bpm, variability: moderate,  accelerations:  Present,  decelerations:  Absent UC:   regular, every 2-3 minutes SVE:   Dilation: 6 Effacement (%): 100 Station: -1 Exam by:: Geneviene Tesch,cnm AROM clear fluid Vertex ROT Labs:   Recent Labs    05/30/22 0142  WBC 9.1  HGB 12.2  HCT  35.3*  PLT 163    Assessment / Plan: G2P1001 31 y.o. [redacted]w[redacted]d IOL at term, effective cervical ripening overnight.  Hx VAVB, LGA, pelvis proven 8'15" Labor:  AROM augmentation at bis time, plan Pitocin PRN Position changes to facilitate fetal rotation and descent Preeclampsia:  no signs or symptoms of toxicity Fetal Wellbeing:  Category I Pain Control:  Epidural I/D:   GBS neg  Anticipated MOD:  NSVD Will follow.   Neta Mends, CNM, MSN 05/30/2022, 10:10 AM

## 2022-05-30 NOTE — Anesthesia Preprocedure Evaluation (Signed)
Anesthesia Evaluation  Patient identified by MRN, date of birth, ID band Patient awake    Reviewed: Allergy & Precautions, Patient's Chart, lab work & pertinent test results  History of Anesthesia Complications (+) PONV and history of anesthetic complications  Airway Mallampati: II  TM Distance: >3 FB Neck ROM: Full    Dental no notable dental hx.    Pulmonary asthma , former smoker,    Pulmonary exam normal breath sounds clear to auscultation       Cardiovascular negative cardio ROS Normal cardiovascular exam Rhythm:Regular Rate:Normal     Neuro/Psych negative neurological ROS  negative psych ROS   GI/Hepatic negative GI ROS, Neg liver ROS,   Endo/Other  negative endocrine ROS  Renal/GU negative Renal ROS  negative genitourinary   Musculoskeletal negative musculoskeletal ROS (+)   Abdominal   Peds negative pediatric ROS (+)  Hematology negative hematology ROS (+) hct 38.1, plt 189   Anesthesia Other Findings   Reproductive/Obstetrics (+) Pregnancy G1P0 "natural delivery" stalled at 9.5cm, requesting epidural                              Anesthesia Physical  Anesthesia Plan  ASA: 2  Anesthesia Plan: Epidural   Post-op Pain Management:    Induction:   PONV Risk Score and Plan: 2  Airway Management Planned: Natural Airway  Additional Equipment: None  Intra-op Plan:   Post-operative Plan:   Informed Consent: I have reviewed the patients History and Physical, chart, labs and discussed the procedure including the risks, benefits and alternatives for the proposed anesthesia with the patient or authorized representative who has indicated his/her understanding and acceptance.       Plan Discussed with:   Anesthesia Plan Comments:         Anesthesia Quick Evaluation

## 2022-05-31 LAB — CBC
HCT: 31 % — ABNORMAL LOW (ref 36.0–46.0)
Hemoglobin: 10.5 g/dL — ABNORMAL LOW (ref 12.0–15.0)
MCH: 29.7 pg (ref 26.0–34.0)
MCHC: 33.9 g/dL (ref 30.0–36.0)
MCV: 87.6 fL (ref 80.0–100.0)
Platelets: 139 10*3/uL — ABNORMAL LOW (ref 150–400)
RBC: 3.54 MIL/uL — ABNORMAL LOW (ref 3.87–5.11)
RDW: 12.9 % (ref 11.5–15.5)
WBC: 11.1 10*3/uL — ABNORMAL HIGH (ref 4.0–10.5)
nRBC: 0 % (ref 0.0–0.2)

## 2022-05-31 MED ORDER — RHO D IMMUNE GLOBULIN 1500 UNIT/2ML IJ SOSY
300.0000 ug | PREFILLED_SYRINGE | Freq: Once | INTRAMUSCULAR | Status: AC
  Administered 2022-05-31: 300 ug via INTRAVENOUS
  Filled 2022-05-31: qty 2

## 2022-05-31 MED ORDER — ACETAMINOPHEN 500 MG PO TABS: 1000.0000 mg | ORAL_TABLET | Freq: Four times a day (QID) | ORAL | 0 refills | Status: AC

## 2022-05-31 MED ORDER — PRENATAL MULTIVITAMIN CH
1.0000 | ORAL_TABLET | Freq: Every day | ORAL | Status: AC
Start: 2022-05-31 — End: ?

## 2022-05-31 MED ORDER — IBUPROFEN 600 MG PO TABS: 600.0000 mg | ORAL_TABLET | Freq: Four times a day (QID) | ORAL | 0 refills | Status: AC

## 2022-05-31 NOTE — Anesthesia Postprocedure Evaluation (Signed)
Anesthesia Post Note  Patient: Demecia Northway  Procedure(s) Performed: AN AD HOC LABOR EPIDURAL     Patient location during evaluation: Mother Baby Anesthesia Type: Epidural Level of consciousness: awake and alert Pain management: pain level controlled Vital Signs Assessment: post-procedure vital signs reviewed and stable Respiratory status: spontaneous breathing, nonlabored ventilation and respiratory function stable Cardiovascular status: stable Postop Assessment: no headache, no backache, epidural receding, no apparent nausea or vomiting, patient able to bend at knees, adequate PO intake and able to ambulate Anesthetic complications: no   No notable events documented.  Last Vitals:  Vitals:   05/31/22 0055 05/31/22 0542  BP: (!) 105/59 103/72  Pulse: 62 62  Resp: 18 18  Temp: 36.7 C 36.5 C  SpO2: 100% 100%    Last Pain:  Vitals:   05/31/22 0542  TempSrc: Oral  PainSc: 3    Pain Goal:                   Laban Emperor

## 2022-05-31 NOTE — Discharge Summary (Signed)
Postpartum Discharge Summary   Patient Name: Carla Escobar DOB: [birth year] MRN: [id number]  Date of admission: 05/30/2022 Delivery date:05/30/2022  Delivering provider: Juliene Pina  Date of discharge: 05/31/2022  Admitting diagnosis: Term pregnancy [Z34.90] Intrauterine pregnancy: [redacted]w[redacted]d    Secondary diagnosis:  Principal Problem:   Postpartum care following vaginal delivery 8/17 Active Problems:   Term pregnancy   Rh negative, maternal   SVD (spontaneous vaginal delivery)   Perineal laceration, second degree     Discharge diagnosis: Term Pregnancy Delivered                                              Post partum procedures:rhogam Augmentation: AROM Complications: None  Hospital course: Onset of Labor With Vaginal Delivery      31y.o. yo G2P2002 at 29w2das admitted in Active Labor on 05/30/2022. Patient had an uncomplicated labor course as follows:  Membrane Rupture Time/Date: 10:09 AM ,05/30/2022   Delivery Method:Vaginal, Spontaneous  Episiotomy: None  Lacerations:  2nd degree  Patient had an uncomplicated postpartum course.  She is ambulating, tolerating a regular diet, passing flatus, and urinating well. Patient is discharged home in stable condition on 05/31/22.  Newborn Data: Birth date:05/30/2022  Birth time:11:05 AM  Gender:Female  Living status:Living  Apgars:9 ,9  Weight:3827 g    Blood pressure 103/72, pulse 62, temperature 97.7 F (36.5 C), temperature source Oral, resp. rate 18, height '5\' 6"'  (1.676 m), weight 86.7 kg, SpO2 100 %, unknown if currently breastfeeding.  Physical Exam:  General: alert and cooperative Lochia: appropriate Uterine Fundus: firm Incision: no concerns DVT Evaluation: No evidence of DVT seen on physical exam.         Latest Ref Rng & Units 05/31/2022    5:26 AM 05/30/2022    1:42 AM    CBC  WBC 4.0 - 10.5 K/uL 11.1  9.1     Hemoglobin 12.0 - 15.0 g/dL 10.5  12.2     Hematocrit 36.0 - 46.0 % 31.0  35.3      Platelets 150 - 400 K/uL 139  163       Rh Neg---> Rhogam today Rub Imm   Magnesium Sulfate received: No BMZ received: No Rhophylac:Yes MMR:N/A T-DaP:Given prenatally Flu: N/A Transfusion:No   Edinburgh Score:     No data to display        No depression /anxiety    After visit meds:  Allergies as of 05/31/2022   No Known Allergies      Medication List     TAKE these medications    acetaminophen 500 MG tablet Commonly known as: TYLENOL Take 2 tablets (1,000 mg total) by mouth every 6 (six) hours.   ibuprofen 600 MG tablet Commonly known as: ADVIL Take 1 tablet (600 mg total) by mouth every 6 (six) hours.   levalbuterol 45 MCG/ACT inhaler Commonly known as: Xopenex HFA Inhale 1-2 puffs into the lungs every 4 (four) hours as needed for wheezing.   prenatal multivitamin Tabs tablet Take 1 tablet by mouth daily at 12 noon.         Discharge home in stable condition Infant Feeding: Breast Infant Disposition:home with mother Discharge instruction: per After Visit Summary and Postpartum booklet. Activity: Advance as tolerated. Pelvic rest for 6 weeks.  Diet: routine diet Anticipated Birth Control: Unsure Postpartum Appointment:6 weeks Additional Postpartum  F/U:  as needed, warning ss and PP care dw pt   Future Appointments:No future appointments.  Follow up Visit:  Follow-up Information     Brien Few, MD Follow up in 6 week(s).   Specialty: Obstetrics and Gynecology Contact information: [place] [address] [phone]                     05/31/2022 Elveria Royals, MD

## 2022-05-31 NOTE — Progress Notes (Signed)
Post Partum Day 1, SVD Boy. G2P2, Desires discharge  Subjective: no complaints, up ad lib, voiding, and tolerating PO. Bleeding reduced to minimal , no pain.   Objective: Blood pressure 103/72, pulse 62, temperature 97.7 F (36.5 C), temperature source Oral, resp. rate 18, height 5\' 6"  (1.676 m), weight 86.7 kg, SpO2 100 %, unknown if currently breastfeeding.  Physical Exam:  General: alert and cooperative Lochia: appropriate Uterine Fundus: firm Incision: no concerns DVT Evaluation: No evidence of DVT seen on physical exam.      Latest Ref Rng & Units 05/31/2022    5:26 AM 05/30/2022    1:42 AM   CBC  WBC 4.0 - 10.5 K/uL 11.1  9.1    Hemoglobin 12.0 - 15.0 g/dL 06/01/2022  91.9    Hematocrit 36.0 - 46.0 % 31.0  35.3    Platelets 150 - 400 K/uL 139  163      Rh Neg---> Rhogam today Rub Imm   Assessment/Plan: Discharge home Pp care/ warning ss dw pt  F/up Dr 16.6  6wks    LOS: 1 day   Billy Coast, MD 05/31/2022, 3:55 PM

## 2022-05-31 NOTE — Plan of Care (Signed)
Discharge instructions given with after visit summary, pt receptive.  

## 2022-05-31 NOTE — Lactation Note (Signed)
This note was copied from a baby's chart. Lactation Consultation Note  Patient Name: Carla Escobar RRNHA'F Date: 05/31/2022 Reason for consult: Follow-up assessment Age:29 hours  P2, Observed feeding with frequent swallows. Baby has been having good long feedings, voids and stools WNL. Reviewed waking techniques and suggest calling as needed.    Feeding Mother's Current Feeding Choice: Breast Milk  LATCH Score Latch: Grasps breast easily, tongue down, lips flanged, rhythmical sucking.  Audible Swallowing: Spontaneous and intermittent  Type of Nipple: Everted at rest and after stimulation  Comfort (Breast/Nipple): Soft / non-tender  Hold (Positioning): No assistance needed to correctly position infant at breast.  LATCH Score: 10    Interventions Interventions: Skin to skin;Education  Consult Status Consult Status: Follow-up Date: 06/01/22 Follow-up type: In-patient    Dahlia Byes [facility] 05/31/2022, 9:11 AM

## 2022-06-01 LAB — RH IG WORKUP (INCLUDES ABO/RH)
Fetal Screen: NEGATIVE
Gestational Age(Wks): 39
Unit division: 0

## 2022-06-04 ENCOUNTER — Inpatient Hospital Stay (HOSPITAL_COMMUNITY): Payer: 59

## 2022-06-10 ENCOUNTER — Telehealth (HOSPITAL_COMMUNITY): Payer: Self-pay

## 2022-06-10 NOTE — Telephone Encounter (Signed)
Patient reports feeling great and adjusting to being home. "I'm very comfortable". Patient declines questions/concerns about her health and healing.  Patient reports that baby is doing well. Eating, peeing/pooping, and gaining weight well. Patient shares that they have a follow up appointment scheduled with her pediatrician. Baby sleeps in a bassinet. RN reviewed ABC's of safe sleep with patient. Patient declines any questions or concerns about baby.  EPDS score is 2.  Marcelino Duster [facility]  06/10/22,1013

## 2022-11-06 ENCOUNTER — Other Ambulatory Visit (HOSPITAL_BASED_OUTPATIENT_CLINIC_OR_DEPARTMENT_OTHER): Payer: Self-pay

## 2023-10-15 NOTE — L&D Delivery Note (Addendum)
   Delivery Note:   [id number] at [redacted]w[redacted]d  Admitting diagnosis: Normal labor and delivery [O80] Risks: Suspected LGA; Rh negative Onset of labor: 04/29/2024 at 1900 IOL/Augmentation: AROM ROM: 04/28/2024 at 2320, clear fluid  Complete dilation at 04/29/2024 0116 Onset of pushing at 0116 FHR second stage Cat I  Analgesia/Anesthesia intrapartum:Epidural Pushing in lithotomy position with CNM and L&D staff support. Husband, Carla Escobar, present for birth and supportive.  Delivery of a Live born female  Birth Weight:  pending APGAR: 6, 8  Newborn Delivery   Birth date/time: 04/29/2024 01:23:10 Delivery type: Vaginal, Spontaneous    in cephalic presentation, position OA to ROA. Slow delivery of fetal head, nuchal x 1 identified and reduced with ease  APGAR:1 min-6 , 5 min-8   Nuchal Cord: Yes  x 1 Cord noted to be torn (not severed) approximately 2 inches from umbilicus, bleeding bright red, quickly identified and double clamped and cut Cord double clamped, cut by Carla Escobar.  Collection of cord blood for typing completed. Arterial cord blood sample-No   Placenta delivered-Spontaneous with 3 vessels. Uterotonics: Pitocin  Placenta to L&D Uterine tone firm  Bleeding moderate, Pitocin  started with fundal massage, bleeding slowed to scant  2nd degree laceration identified.  Episiotomy:None Local analgesia: N/A  Repair: 3-0 in usual fashion Est. Blood Loss (mL):663.00  Complications: None  Mom to postpartum. Baby Carla Escobar to Couplet care / Skin to Skin.  Delivery Report:   Review the Delivery Report for details.    Carla Escobar, CNM, MSN 04/29/2024, 1:58 AM

## 2024-04-08 LAB — OB RESULTS CONSOLE GBS: GBS: NEGATIVE

## 2024-04-28 ENCOUNTER — Other Ambulatory Visit: Payer: Self-pay

## 2024-04-28 ENCOUNTER — Encounter: Payer: Self-pay | Admitting: Anesthesiology

## 2024-04-28 ENCOUNTER — Inpatient Hospital Stay (HOSPITAL_COMMUNITY)
Admission: AD | Admit: 2024-04-28 | Discharge: 2024-04-30 | DRG: 807 | Disposition: A | Discharge status: Home / Self Care | Attending: Obstetrics and Gynecology | Admitting: Obstetrics and Gynecology

## 2024-04-28 ENCOUNTER — Inpatient Hospital Stay (HOSPITAL_COMMUNITY): Admitting: Anesthesiology

## 2024-04-28 ENCOUNTER — Encounter (HOSPITAL_COMMUNITY): Payer: Self-pay | Admitting: Obstetrics and Gynecology

## 2024-04-28 DIAGNOSIS — O26893 Other specified pregnancy related conditions, third trimester: Secondary | ICD-10-CM | POA: Diagnosis present

## 2024-04-28 DIAGNOSIS — Z87891 Personal history of nicotine dependence: Secondary | ICD-10-CM | POA: Diagnosis not present

## 2024-04-28 DIAGNOSIS — Z3A38 38 weeks gestation of pregnancy: Secondary | ICD-10-CM | POA: Diagnosis not present

## 2024-04-28 DIAGNOSIS — K219 Gastro-esophageal reflux disease without esophagitis: Secondary | ICD-10-CM | POA: Diagnosis present

## 2024-04-28 DIAGNOSIS — Z8249 Family history of ischemic heart disease and other diseases of the circulatory system: Secondary | ICD-10-CM

## 2024-04-28 DIAGNOSIS — O99214 Obesity complicating childbirth: Secondary | ICD-10-CM | POA: Diagnosis present

## 2024-04-28 DIAGNOSIS — O3663X0 Maternal care for excessive fetal growth, third trimester, not applicable or unspecified: Principal | ICD-10-CM | POA: Diagnosis present

## 2024-04-28 DIAGNOSIS — O9962 Diseases of the digestive system complicating childbirth: Secondary | ICD-10-CM | POA: Diagnosis present

## 2024-04-28 DIAGNOSIS — Z6791 Unspecified blood type, Rh negative: Secondary | ICD-10-CM | POA: Diagnosis not present

## 2024-04-28 DIAGNOSIS — Z833 Family history of diabetes mellitus: Secondary | ICD-10-CM

## 2024-04-28 LAB — CBC
HCT: 39.5 % (ref 36.0–46.0)
Hemoglobin: 13.1 g/dL (ref 12.0–15.0)
MCH: 28.9 pg (ref 26.0–34.0)
MCHC: 33.2 g/dL (ref 30.0–36.0)
MCV: 87.2 fL (ref 80.0–100.0)
Platelets: 174 K/uL (ref 150–400)
RBC: 4.53 MIL/uL (ref 3.87–5.11)
RDW: 13.1 % (ref 11.5–15.5)
WBC: 12.9 K/uL — ABNORMAL HIGH (ref 4.0–10.5)
nRBC: 0 % (ref 0.0–0.2)

## 2024-04-28 MED ORDER — OXYTOCIN-SODIUM CHLORIDE 30-0.9 UT/500ML-% IV SOLN
2.5000 [IU]/h | INTRAVENOUS | Status: DC
  Administered 2024-04-29: 2.5 [IU]/h via INTRAVENOUS
  Filled 2024-04-28: qty 500

## 2024-04-28 MED ORDER — LACTATED RINGERS IV SOLN: 500.0000 mL | Freq: Once | INTRAVENOUS | Status: DC

## 2024-04-28 MED ORDER — PHENYLEPHRINE 80 MCG/ML (10ML) SYRINGE FOR IV PUSH (FOR BLOOD PRESSURE SUPPORT): 80.0000 ug | PREFILLED_SYRINGE | INTRAVENOUS | Status: DC | PRN

## 2024-04-28 MED ORDER — LACTATED RINGERS IV SOLN
500.0000 mL | INTRAVENOUS | Status: DC | PRN
  Administered 2024-04-28: 1000 mL via INTRAVENOUS

## 2024-04-28 MED ORDER — FENTANYL-BUPIVACAINE-NACL 0.5-0.125-0.9 MG/250ML-% EP SOLN
12.0000 mL/h | EPIDURAL | Status: DC | PRN
  Administered 2024-04-28: 12 mL/h via EPIDURAL
  Filled 2024-04-28: qty 250

## 2024-04-28 MED ORDER — FLEET ENEMA RE ENEM: 1.0000 | ENEMA | RECTAL | Status: DC | PRN

## 2024-04-28 MED ORDER — DIPHENHYDRAMINE HCL 50 MG/ML IJ SOLN: 12.5000 mg | INTRAMUSCULAR | Status: DC | PRN

## 2024-04-28 MED ORDER — ACETAMINOPHEN 325 MG PO TABS: 650.0000 mg | ORAL_TABLET | ORAL | Status: DC | PRN

## 2024-04-28 MED ORDER — OXYTOCIN BOLUS FROM INFUSION
333.0000 mL | Freq: Once | INTRAVENOUS | Status: AC
  Administered 2024-04-29: 333 mL via INTRAVENOUS

## 2024-04-28 MED ORDER — EPHEDRINE 5 MG/ML INJ: 10.0000 mg | INTRAVENOUS | Status: DC | PRN

## 2024-04-28 MED ORDER — ONDANSETRON HCL 4 MG/2ML IJ SOLN
4.0000 mg | Freq: Four times a day (QID) | INTRAMUSCULAR | Status: DC | PRN
  Administered 2024-04-28: 4 mg via INTRAVENOUS
  Filled 2024-04-28: qty 2

## 2024-04-28 MED ORDER — OXYCODONE-ACETAMINOPHEN 5-325 MG PO TABS: 1.0000 | ORAL_TABLET | ORAL | Status: DC | PRN

## 2024-04-28 MED ORDER — LIDOCAINE HCL (PF) 1 % IJ SOLN: 30.0000 mL | INTRAMUSCULAR | Status: DC | PRN

## 2024-04-28 MED ORDER — OXYCODONE-ACETAMINOPHEN 5-325 MG PO TABS: 2.0000 | ORAL_TABLET | ORAL | Status: DC | PRN

## 2024-04-28 MED ORDER — SOD CITRATE-CITRIC ACID 500-334 MG/5ML PO SOLN: 30.0000 mL | ORAL | Status: DC | PRN

## 2024-04-28 MED ORDER — LIDOCAINE HCL (PF) 1 % IJ SOLN
INTRAMUSCULAR | Status: DC | PRN
Start: 2024-04-28 — End: 2024-04-29
  Administered 2024-04-28 (×2): 5 mL via EPIDURAL

## 2024-04-28 MED ORDER — LACTATED RINGERS IV SOLN: INTRAVENOUS | Status: DC

## 2024-04-28 NOTE — Anesthesia Procedure Notes (Signed)
 Epidural Patient location during procedure: OB Start time: 04/28/2024 10:53 PM End time: 04/28/2024 11:01 PM  Staffing Anesthesiologist: Jerrye Sharper, MD Performed: anesthesiologist   Preanesthetic Checklist Completed: patient identified, IV checked, site marked, risks and benefits discussed, surgical consent, monitors and equipment checked, pre-op evaluation and timeout performed  Epidural Patient position: sitting Prep: DuraPrep and site prepped and draped Patient monitoring: continuous pulse ox and blood pressure Approach: midline Location: L3-L4 Injection technique: LOR air  Needle:  Needle type: Tuohy  Needle gauge: 17 G Needle length: 9 cm and 9 Needle insertion depth: 5 cm Catheter type: closed end flexible Catheter size: 19 Gauge Catheter at skin depth: 10 cm Test dose: negative and Other  Assessment Events: blood not aspirated, no cerebrospinal fluid, injection not painful, no injection resistance, no paresthesia and negative IV test  Additional Notes Patient identified. Risks and benefits discussed including failed block, incomplete  Pain control, post dural puncture headache, nerve damage, paralysis, blood pressure Changes, nausea, vomiting, reactions to medications-both toxic and allergic and post Partum back pain. All questions were answered. Patient expressed understanding and wished to proceed. Sterile technique was used throughout procedure. Epidural site was Dressed with sterile barrier dressing. No paresthesias, signs of intravascular injection Or signs of intrathecal spread were encountered.  Patient was more comfortable after the epidural was dosed. Please see RN's note for documentation of vital signs and FHR which are stable. Reason for block:procedure for pain

## 2024-04-28 NOTE — Anesthesia Preprocedure Evaluation (Addendum)
 Anesthesia Evaluation  Patient identified by MRN, date of birth, ID band Patient awake    Reviewed: Allergy & Precautions, Patient's Chart, lab work & pertinent test results  History of Anesthesia Complications (+) PONV and history of anesthetic complications  Airway Mallampati: II  TM Distance: >3 FB Neck ROM: Full    Dental no notable dental hx. (+) Teeth Intact   Pulmonary asthma , former smoker   Pulmonary exam normal breath sounds clear to auscultation       Cardiovascular negative cardio ROS Normal cardiovascular exam Rhythm:Regular Rate:Normal     Neuro/Psych negative neurological ROS  negative psych ROS   GI/Hepatic Neg liver ROS,GERD  ,,  Endo/Other  Obesity  Renal/GU negative Renal ROS  negative genitourinary   Musculoskeletal negative musculoskeletal ROS (+)    Abdominal  (+) + obese  Peds negative pediatric ROS (+)  Hematology negative hematology ROS (+) hct 38.1, plt 189   Anesthesia Other Findings   Reproductive/Obstetrics (+) Pregnancy                              Anesthesia Physical Anesthesia Plan  ASA: 2  Anesthesia Plan: Epidural   Post-op Pain Management:    Induction:   PONV Risk Score and Plan: 2  Airway Management Planned: Natural Airway  Additional Equipment: None and Fetal Monitoring  Intra-op Plan:   Post-operative Plan:   Informed Consent: I have reviewed the patients History and Physical, chart, labs and discussed the procedure including the risks, benefits and alternatives for the proposed anesthesia with the patient or authorized representative who has indicated his/her understanding and acceptance.       Plan Discussed with: Anesthesiologist  Anesthesia Plan Comments:          Anesthesia Quick Evaluation

## 2024-04-28 NOTE — H&P (Signed)
 OB ADMISSION/ HISTORY & PHYSICAL:  Admission Date: 04/28/2024  9:10 PM  Admit Diagnosis: Normal labor and delivery [O80]    Carla Escobar is a 33 y.o. female G3P2002 at [redacted]w[redacted]d presenting for labor. States contractions started at 1700 and are now 2-4 minutes apart. Denies vaginal bleeding or leaking of fluid. Endorses + fetal movement. Husband, Carla Escobar, present and supportive. Eagerly anticipating a surprise baby!  Prenatal History: [id number]   EDC: 05/08/2024 Prenatal care at [facility] since 10 weeks  Primary: A. Joshua, CNM, transfer from Dr. 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 Suspected LGA, last EFW 95% at [redacted]w[redacted]d Rh negative  Prenatal Labs: ABO, Rh:   A NEG Antibody: PENDING (07/16 2217) Rubella:   Immune RPR:   Non-reactive HBsAg:   Negative HIV:   Negative GBS: Negative/-- (06/26 0000)  1 hr Glucola : 110 Genetic Screening: Low risk Panorama Ultrasound: normal anatomy, EFW at [redacted]w[redacted]d 95%    Maternal Diabetes: No Genetic Screening: Normal Maternal Ultrasounds/Referrals: Normal Fetal Ultrasounds or other Referrals:  None Maternal Substance Abuse:  No Significant Maternal Medications:  None Significant Maternal Lab Results:  Group B Strep negative and Rh negative Other Comments:  None  Medical / Surgical History : Past medical history:  Past Medical History:  Diagnosis Date   Asthma    PONV (postoperative nausea and vomiting)     Past surgical history:  Past Surgical History:  Procedure Laterality Date   ELBOW SURGERY     none      Family History:  Family History  Problem Relation Age of Onset   High blood pressure Father    Heart disease Father    Alcohol abuse Father    Diabetes Father    Stroke Father    Multiple myeloma Maternal Grandmother 75   Bone cancer Maternal Grandfather 73   Leukemia Paternal Grandmother    Bone cancer Paternal Grandfather     Social History:  reports that she quit smoking  about 16 years ago. Her smoking use included cigarettes. She started smoking about 17 years ago. She has never used smokeless tobacco. She reports current alcohol use of about 1.0 standard drink of alcohol per week. She reports that she does not use drugs.  Allergies: Patient has no known allergies.   Current Medications at time of admission:  Medications Prior to Admission  Medication Sig Dispense Refill Last Dose/Taking   ferrous sulfate 325 (65 FE) MG EC tablet Take 325 mg by mouth 3 (three) times daily with meals.   04/27/2024 Bedtime   Prenatal Vit-Fe Fumarate-FA (PRENATAL MULTIVITAMIN) TABS tablet Take 1 tablet by mouth daily at 12 noon.   04/27/2024 Bedtime   acetaminophen  (TYLENOL ) 500 MG tablet Take 2 tablets (1,000 mg total) by mouth every 6 (six) hours. 30 tablet 0    ibuprofen  (ADVIL ) 600 MG tablet Take 1 tablet (600 mg total) by mouth every 6 (six) hours. 30 tablet 0    levalbuterol  (XOPENEX  HFA) 45 MCG/ACT inhaler Inhale 1-2 puffs into the lungs every 4 (four) hours as needed for wheezing. (Patient not taking: Reported on 07/29/2018) 1 Inhaler 5     Review of Systems: Review of Systems  All other systems reviewed and are negative.  Physical Exam: Vital signs and nursing notes reviewed.  Patient Vitals for the past 24 hrs:  BP Temp Temp src Pulse Resp SpO2 Height Weight  04/28/24 2136 117/73 98.5 F (36.9 C) Oral (!) 110 17  98 % -- --  04/28/24 2123 -- -- -- -- -- -- 5' 7 (1.702 m) 87.6 kg    General: AAO x 3, NAD Heart: RRR Lungs:CTAB Abdomen: Gravid, NT Extremities: no edema SVE: Dilation: 5 Effacement (%): 80, 90 Station: -2 Presentation: Vertex Exam by:: Carla Punter, RN   FHR: 150BPM, moderate variability, + accels, no decels TOCO: Contractions q 3-4 minutes  Labs:   Recent Labs    04/28/24 2217  WBC 12.9*  HGB 13.1  HCT 39.5  PLT 174   Assessment/Plan: 33 y.o. G3P2002 at [redacted]w[redacted]d, active labor Rh negative, s/p Rhogam at 29 weeks Suspected LGA,  EFW 95% at [redacted]w[redacted]d History of VAVD and 3rd degree laceration with G1  Fetal wellbeing - FHT category 1 EFW LGA 8-9lbs  Labor: Plan AROM for augmentation, Pitocin  prn  GBS negative Rubella immune Rh negative, s/p Rhogam at 29 weeks  Pain control: desires epidural, may place on request Analgesia/anesthesia PRN  Anticipated MOD: NSVB  Plans to breastfeed, plans inpatient circumcision. POC discussed with patient and support team, all questions answered.  Dr. Armond notified of admission/plan of care.  Carla Escobar CNM, MSN 04/28/2024, 10:30 PM

## 2024-04-28 NOTE — MAU Note (Signed)
 Triage Note  Carla Escobar is a 33 y.o. at [redacted]w[redacted]d here in MAU reporting: reports she had a membrane sweep this afternoon and was 4cm. Reports ctx q66min for the past 2 hours. Denies VB and LOF. Endorses +FM  LMP: 07/26/23  Pain score: 6/10 Vitals:   04/28/24 2136  BP: 117/73  Pulse: (!) 110  Resp: 17  Temp: 98.5 F (36.9 C)  SpO2: 98%     FHT: 161  Lab orders placed from triage: MAU labor

## 2024-04-29 ENCOUNTER — Encounter (HOSPITAL_COMMUNITY): Payer: Self-pay | Admitting: Obstetrics & Gynecology

## 2024-04-29 LAB — CBC
HCT: 32.5 % — ABNORMAL LOW (ref 36.0–46.0)
Hemoglobin: 11 g/dL — ABNORMAL LOW (ref 12.0–15.0)
MCH: 29.6 pg (ref 26.0–34.0)
MCHC: 33.8 g/dL (ref 30.0–36.0)
MCV: 87.6 fL (ref 80.0–100.0)
Platelets: 164 K/uL (ref 150–400)
RBC: 3.71 MIL/uL — ABNORMAL LOW (ref 3.87–5.11)
RDW: 13.1 % (ref 11.5–15.5)
WBC: 16.6 K/uL — ABNORMAL HIGH (ref 4.0–10.5)
nRBC: 0 % (ref 0.0–0.2)

## 2024-04-29 LAB — RPR: RPR Ser Ql: NONREACTIVE

## 2024-04-29 LAB — TYPE AND SCREEN
ABO/RH(D): A NEG
Antibody Screen: POSITIVE

## 2024-04-29 MED ORDER — ONDANSETRON HCL 4 MG/2ML IJ SOLN: 4.0000 mg | INTRAMUSCULAR | Status: DC | PRN

## 2024-04-29 MED ORDER — ONDANSETRON HCL 4 MG PO TABS
4.0000 mg | ORAL_TABLET | ORAL | Status: DC | PRN
Start: 2024-04-29 — End: 2024-04-30

## 2024-04-29 MED ORDER — ZOLPIDEM TARTRATE 5 MG PO TABS: 5.0000 mg | ORAL_TABLET | Freq: Every evening | ORAL | Status: DC | PRN

## 2024-04-29 MED ORDER — WITCH HAZEL-GLYCERIN EX PADS
1.0000 | MEDICATED_PAD | CUTANEOUS | Status: DC | PRN
  Administered 2024-04-29: 1 via TOPICAL

## 2024-04-29 MED ORDER — SIMETHICONE 80 MG PO CHEW: 80.0000 mg | CHEWABLE_TABLET | ORAL | Status: DC | PRN

## 2024-04-29 MED ORDER — TETANUS-DIPHTH-ACELL PERTUSSIS 5-2.5-18.5 LF-MCG/0.5 IM SUSY: 0.5000 mL | PREFILLED_SYRINGE | Freq: Once | INTRAMUSCULAR | Status: DC

## 2024-04-29 MED ORDER — COCONUT OIL OIL: 1.0000 | TOPICAL_OIL | Status: DC | PRN

## 2024-04-29 MED ORDER — SENNOSIDES-DOCUSATE SODIUM 8.6-50 MG PO TABS
2.0000 | ORAL_TABLET | Freq: Every day | ORAL | Status: DC
  Administered 2024-04-30: 2 via ORAL
  Filled 2024-04-29: qty 2

## 2024-04-29 MED ORDER — PRENATAL MULTIVITAMIN CH
1.0000 | ORAL_TABLET | Freq: Every day | ORAL | Status: DC
  Administered 2024-04-29 – 2024-04-30 (×2): 1 via ORAL
  Filled 2024-04-29 (×2): qty 1

## 2024-04-29 MED ORDER — IBUPROFEN 600 MG PO TABS
600.0000 mg | ORAL_TABLET | Freq: Four times a day (QID) | ORAL | Status: DC
  Administered 2024-04-29 – 2024-04-30 (×6): 600 mg via ORAL
  Filled 2024-04-29 (×6): qty 1

## 2024-04-29 MED ORDER — ACETAMINOPHEN 325 MG PO TABS: 650.0000 mg | ORAL_TABLET | ORAL | Status: DC | PRN

## 2024-04-29 MED ORDER — DIBUCAINE (PERIANAL) 1 % EX OINT
1.0000 | TOPICAL_OINTMENT | CUTANEOUS | Status: DC | PRN
Start: 2024-04-29 — End: 2024-04-30

## 2024-04-29 MED ORDER — BENZOCAINE-MENTHOL 20-0.5 % EX AERO
1.0000 | INHALATION_SPRAY | CUTANEOUS | Status: DC | PRN
  Administered 2024-04-29: 1 via TOPICAL
  Filled 2024-04-29: qty 56

## 2024-04-29 MED ORDER — RHO D IMMUNE GLOBULIN 1500 UNIT/2ML IJ SOSY
300.0000 ug | PREFILLED_SYRINGE | Freq: Once | INTRAMUSCULAR | Status: AC
  Administered 2024-04-29: 300 ug via INTRAVENOUS
  Filled 2024-04-29: qty 2

## 2024-04-29 MED ORDER — DIPHENHYDRAMINE HCL 25 MG PO CAPS: 25.0000 mg | ORAL_CAPSULE | Freq: Four times a day (QID) | ORAL | Status: DC | PRN

## 2024-04-29 NOTE — Progress Notes (Signed)
   PPD #0 S/P NSVD  Live born female  Birth Weight: 8 lb 4.3 oz (3750 g) APGAR: 6, 8  Newborn Delivery   Birth date/time: 04/29/2024 01:23:10 Delivery type: Vaginal, Spontaneous    Baby name: Carla Escobar, female.   Delivering provider: JOSHUA PALMA K  Lacerations: 2nd degree  Circumcision: Desiring today/tomorrow.   Feeding: breast  Pain control at delivery: Epidural  S:  Reports feeling well, fatigue as PPD0.              Tolerating PO/No nausea or vomiting             Bleeding is light             Pain controlled with acetaminophen  and ibuprofen  (OTC)             Up ad lib/ambulatory/voiding without difficulties   O:  A & O x 3, in no apparent distress              VS:  Vitals:   04/29/24 0215 04/29/24 0230 04/29/24 0315 04/29/24 0407  BP: 110/73 (!) 119/55 96/67 91/64   Pulse: (!) 101 96 77 87  Resp:   16 16  Temp:   98.5 F (36.9 C) 98.4 F (36.9 C)  TempSrc:   Oral Oral  SpO2:   98% 100%  Weight:      Height:        LABS:  Recent Labs    04/28/24 2217 04/29/24 0438  WBC 12.9* 16.6*  HGB 13.1 11.0*  HCT 39.5 32.5*  PLT 174 164    Blood type: --/--/A NEG (07/16 2217)  Rubella:     I&O: I/O last 3 completed shifts: In: -  Out: 1113 [Urine:450; Blood:663]          No intake/output data recorded.  Gen: AAO x 3, NAD Abdomen: soft, non-tender, non-distended Fundus: firm, non-tender, at U. Perineum: Deferred. Lochia: Light/minimal.PPD0. Extremities: No edema, no calf pain or tenderness   A/P:  PPD # 0 33 y.o., [id number]   Principal Problem:   Postpartum care following vaginal delivery 7/17 Active Problems:   Rh negative, maternal   SVD (spontaneous vaginal delivery)   Perineal laceration, second degree   Normal labor and delivery    Doing well - stable status. Encourage rest and skin-to-skin bonding.   Routine post partum orders.  Desiring circ.  Anticipate discharge tomorrow, desiring early discharge.    Carla KANDICE Both, MSN, CNM 04/29/2024,  10:13 AM

## 2024-04-29 NOTE — Anesthesia Postprocedure Evaluation (Signed)
 Anesthesia Post Note  Patient: Carla Escobar  Procedure(s) Performed: AN AD HOC LABOR EPIDURAL     Patient location during evaluation: Mother Baby Anesthesia Type: Epidural Level of consciousness: awake, oriented and awake and alert Pain management: pain level controlled Vital Signs Assessment: post-procedure vital signs reviewed and stable Respiratory status: spontaneous breathing, nonlabored ventilation and respiratory function stable Cardiovascular status: stable Postop Assessment: no headache, patient able to bend at knees, adequate PO intake, able to ambulate and no apparent nausea or vomiting Anesthetic complications: no   No notable events documented.  Last Vitals:  Vitals:   04/29/24 0315 04/29/24 0407  BP: 96/67 91/64  Pulse: 77 87  Resp: 16 16  Temp: 36.9 C 36.9 C  SpO2: 98% 100%    Last Pain:  Vitals:   04/29/24 0715  TempSrc:   PainSc: 0-No pain   Pain Goal:                   [name]

## 2024-04-29 NOTE — Lactation Note (Signed)
 This note was copied from a baby's chart. Lactation Consultation Note  Patient Name: Boy Brannon Decaire Unijb'd Date: 04/29/2024 Age:31 hours Reason for consult: Initial assessment;Early term 37-38.6wks Experienced BF mom just stopped BF her 2nd child 4 months ago.  Mom stated this baby is BF well and often. Baby is wanting to feed every hr to hr 1/2. Explained to mom since she BF her other children so long she has a lot of colostrum and the baby is liking it and will want to cluster feed now instead of waiting 24 hrs. Like 1st babies.  Mom was BF when LC came into rm. Saw a good latch, just got mom to straighten up body alignment to prevent sore nipples. Mom had good support during feeding. Reminded mom of newborn feeding habits, STS, I&O, body alignment.  Mom encouraged to feed baby 8-12 times/24 hours and with feeding cues.  Mom feels like BF is going well w/no issues. Mom stated it was OK to put her PRN. Mom will call if she needs anything or has questions. Maternal Data Does the patient have breastfeeding experience prior to this delivery?: Yes How long did the patient breastfeed?: 1 st child 67 1/2 yrs old for 2 1/2 yrs. mom tandum BF for 6 months w/1st and 2nd child. mom BF 2nd child now 2 yrs old for 19 months. mom stopped BF him 4 months ago.  Feeding    LATCH Score Latch: Grasps breast easily, tongue down, lips flanged, rhythmical sucking.  Audible Swallowing: Spontaneous and intermittent  Type of Nipple: Everted at rest and after stimulation  Comfort (Breast/Nipple): Soft / non-tender  Hold (Positioning): No assistance needed to correctly position infant at breast.  LATCH Score: 10   Lactation Tools Discussed/Used    Interventions Interventions: Breast feeding basics reviewed;Adjust position;Education;LC Services brochure;CDC milk storage guidelines  Discharge    Consult Status Consult Status: PRN    Jhade Berko G 04/29/2024, 6:36 AM

## 2024-04-29 NOTE — Lactation Note (Signed)
 This note was copied from a baby's chart. Lactation Consultation Note  Patient Name: Carla Escobar Date: 04/29/2024 Age:31 hours  Attempted to see mom but she was sleeping.   Maternal Data    Feeding    LATCH Score                    Lactation Tools Discussed/Used    Interventions    Discharge    Consult Status      Carla Escobar 04/29/2024, 4:04 AM

## 2024-04-30 ENCOUNTER — Other Ambulatory Visit (HOSPITAL_BASED_OUTPATIENT_CLINIC_OR_DEPARTMENT_OTHER): Payer: Self-pay

## 2024-04-30 LAB — RH IG WORKUP (INCLUDES ABO/RH)
Fetal Screen: NEGATIVE
Gestational Age(Wks): 38.5
Unit division: 0

## 2024-04-30 MED ORDER — BENZOCAINE-MENTHOL 20-0.5 % EX AERO
1.0000 | INHALATION_SPRAY | CUTANEOUS | 0 refills | Status: AC | PRN
  Filled 2024-04-30: qty 78, fill #0

## 2024-04-30 MED ORDER — SENNOSIDES-DOCUSATE SODIUM 8.6-50 MG PO TABS
2.0000 | ORAL_TABLET | Freq: Every day | ORAL | 0 refills | Status: AC
  Filled 2024-04-30: qty 30, 15d supply, fill #0

## 2024-04-30 NOTE — Plan of Care (Signed)
  Problem: Education: Goal: Knowledge of General Education information will improve Description: Including pain rating scale, medication(s)/side effects and non-pharmacologic comfort measures Outcome: Adequate for Discharge   Problem: Health Behavior/Discharge Planning: Goal: Ability to manage health-related needs will improve Outcome: Adequate for Discharge   Problem: Clinical Measurements: Goal: Ability to maintain clinical measurements within normal limits will improve Outcome: Adequate for Discharge Goal: Will remain free from infection Outcome: Adequate for Discharge Goal: Diagnostic test results will improve Outcome: Adequate for Discharge Goal: Respiratory complications will improve Outcome: Adequate for Discharge Goal: Cardiovascular complication will be avoided Outcome: Adequate for Discharge   Problem: Activity: Goal: Risk for activity intolerance will decrease Outcome: Adequate for Discharge   Problem: Nutrition: Goal: Adequate nutrition will be maintained Outcome: Adequate for Discharge   Problem: Coping: Goal: Level of anxiety will decrease Outcome: Adequate for Discharge   Problem: Elimination: Goal: Will not experience complications related to bowel motility Outcome: Adequate for Discharge Goal: Will not experience complications related to urinary retention Outcome: Adequate for Discharge   Problem: Pain Managment: Goal: General experience of comfort will improve and/or be controlled Outcome: Adequate for Discharge   Problem: Safety: Goal: Ability to remain free from injury will improve Outcome: Adequate for Discharge   Problem: Skin Integrity: Goal: Risk for impaired skin integrity will decrease Outcome: Adequate for Discharge   Problem: Education: Goal: Knowledge of Childbirth will improve Outcome: Adequate for Discharge Goal: Ability to make informed decisions regarding treatment and plan of care will improve Outcome: Adequate for  Discharge Goal: Ability to state and carry out methods to decrease the pain will improve Outcome: Adequate for Discharge Goal: Individualized Educational Video(s) Outcome: Adequate for Discharge   Problem: Coping: Goal: Ability to verbalize concerns and feelings about labor and delivery will improve Outcome: Adequate for Discharge   Problem: Life Cycle: Goal: Ability to make normal progression through stages of labor will improve Outcome: Adequate for Discharge Goal: Ability to effectively push during vaginal delivery will improve Outcome: Adequate for Discharge   Problem: Role Relationship: Goal: Will demonstrate positive interactions with the child Outcome: Adequate for Discharge   Problem: Safety: Goal: Risk of complications during labor and delivery will decrease Outcome: Adequate for Discharge   Problem: Pain Management: Goal: Relief or control of pain from uterine contractions will improve Outcome: Adequate for Discharge   Problem: Education: Goal: Knowledge of condition will improve Outcome: Adequate for Discharge Goal: Individualized Educational Video(s) Outcome: Adequate for Discharge Goal: Individualized Newborn Educational Video(s) Outcome: Adequate for Discharge   Problem: Activity: Goal: Will verbalize the importance of balancing activity with adequate rest periods Outcome: Adequate for Discharge Goal: Ability to tolerate increased activity will improve Outcome: Adequate for Discharge   Problem: Coping: Goal: Ability to identify and utilize available resources and services will improve Outcome: Adequate for Discharge   Problem: Life Cycle: Goal: Chance of risk for complications during the postpartum period will decrease Outcome: Adequate for Discharge   Problem: Role Relationship: Goal: Ability to demonstrate positive interaction with newborn will improve Outcome: Adequate for Discharge   Problem: Skin Integrity: Goal: Demonstration of wound healing  without infection will improve Outcome: Adequate for Discharge

## 2024-04-30 NOTE — Progress Notes (Signed)
 Mom stated she would like to sleep uninterrupted till 5 am and requested that nursing staff limit entering her room if possible. RN informed mom that the nursing team will be notified about her request and encouraged mom to call if she needed any assistance.

## 2024-04-30 NOTE — Progress Notes (Signed)
   PPD #1 S/P NSVD  Live born female  Birth Weight: 8 lb 4.3 oz (3750 g) APGAR: 6, 8  Newborn Delivery   Birth date/time: 04/29/2024 01:23:10 Delivery type: Vaginal, Spontaneous    Baby name: Carla Escobar.  Delivering provider: JOSHUA PALMA K  Lacerations: 2nd degree.  Circumcision: To be completed today by Dr. Linnell.  Feeding: breast.  Pain control at delivery: Epidural  S:  Reports feeling well overall, desiring discharge today.             Tolerating PO/No nausea or vomiting.             Bleeding is light.             Pain controlled with acetaminophen  and ibuprofen  (OTC).             Up ad lib/ambulatory/voiding without difficulties. No BM at this time but feels flatus.   O:  A & O x 3, in no apparent distress              VS:  Vitals:   04/29/24 1230 04/29/24 1640 04/29/24 2053 04/30/24 0604  BP: 97/60 113/69 (!) 95/58 106/71  Pulse: 75 75 80 79  Resp: 18 18 18 18   Temp: 98.1 F (36.7 C) (!) 97.5 F (36.4 C) 97.6 F (36.4 C) 98 F (36.7 C)  TempSrc: Oral Oral Oral Oral  SpO2:   100% 100%  Weight:      Height:        LABS:  Recent Labs    04/28/24 2217 04/29/24 0438  WBC 12.9* 16.6*  HGB 13.1 11.0*  HCT 39.5 32.5*  PLT 174 164    Blood type: --/--/A NEG (07/16 2217)  Rubella:   immune.  I&O: I/O last 3 completed shifts: In: -  Out: 1113 [Urine:450; Blood:663]          No intake/output data recorded.  Gen: AAO x 3, NAD. Abdomen: soft, non-tender, non-distended Fundus: firm, non-tender, U-below.  Perineum: Healing well, minimal but appropriate swelling/tenderness.  Lochia: Light.  Extremities: Minimal edema, no calf pain or tenderness. Slight swelling in hands, worse upon waking per pt report.    A/P:  PPD # 1 33 y.o., [id number]   Principal Problem:   Postpartum care following vaginal delivery 7/17 Active Problems:   Rh negative, maternal   SVD (spontaneous vaginal delivery)   Perineal laceration, second degree   Normal labor and  delivery    Doing well - stable status. Desiring discharge home today.  Encouraged ambulation, hydration and balanced diet upon discharge to facilitate pptm healing.   Reviewed pptm signs of concern and when to call for; bleeding, pain, infection, signs of pptm.   Routine post partum and discharge orders placed.   Anticipate discharge s/p female infant circ this AM.   Dr. Linnell aware of POC and pt discharge today.    Carla KANDICE Both, MSN, CNM 04/30/2024, 8:52 AM

## 2024-04-30 NOTE — Discharge Summary (Signed)
 Postpartum Discharge Summary  Date of Service updated 7/18.      Patient Name: Carla Escobar DOB: 1991/07/18 MRN: [id number]  Date of admission: 04/28/2024 Delivery date:04/29/2024 Delivering provider: JOSHUA PALMA K Date of discharge: 04/30/2024  Admitting diagnosis: Normal labor and delivery [O80] Intrauterine pregnancy: [redacted]w[redacted]d     Secondary diagnosis:  Principal Problem:   Postpartum care following vaginal delivery 7/17 Active Problems:   Rh negative, maternal   SVD (spontaneous vaginal delivery)   Perineal laceration, second degree   Normal labor and delivery  Additional problems: Rh negative.     Discharge diagnosis: Term Pregnancy Delivered                                              Post partum procedures:rhogam Augmentation: N/A Complications: None  Hospital course: Onset of Labor With Vaginal Delivery      33 y.o. yo G3P3003 at [redacted]w[redacted]d was admitted in Active Labor on 04/28/2024. Labor course was complicated by Rh negative status.   Membrane Rupture Time/Date: 11:20 PM,04/28/2024  Delivery Method:Vaginal, Spontaneous Operative Delivery:N/A Episiotomy: None Lacerations:  2nd degree Patient had a postpartum course complicated by Rh negative status.  She is ambulating, tolerating a regular diet, passing flatus, and urinating well. Patient is discharged home in stable condition on 04/30/24.  Newborn Data: Birth date:04/29/2024 Birth time:1:23 AM Gender:Female Living status:Living Apgars:6 ,8  Weight:3750 g  Magnesium Sulfate received: No BMZ received: No Rhophylac :Yes MMR:No T-DaP:Given prenatally Flu: Yes RSV Vaccine received: Yes Transfusion:No Immunizations administered: There is no immunization history for the selected administration types on file for this patient.  Physical exam  Vitals:   04/29/24 1230 04/29/24 1640 04/29/24 2053 04/30/24 0604  BP: 97/60 113/69 (!) 95/58 106/71  Pulse: 75 75 80 79  Resp: 18 18 18 18   Temp: 98.1 F (36.7 C)  (!) 97.5 F (36.4 C) 97.6 F (36.4 C) 98 F (36.7 C)  TempSrc: Oral Oral Oral Oral  SpO2:   100% 100%  Weight:      Height:       General: alert, cooperative, and no distress Lochia: appropriate Uterine Fundus: firm Incision: Healing well with no significant drainage DVT Evaluation: No evidence of DVT seen on physical exam. Labs: Lab Results  Component Value Date   WBC 16.6 (H) 04/29/2024   HGB 11.0 (L) 04/29/2024   HCT 32.5 (L) 04/29/2024   MCV 87.6 04/29/2024   PLT 164 04/29/2024      Latest Ref Rng & Units 07/29/2018    3:52 PM  CMP  Glucose 70 - 99 mg/dL 89   BUN 6 - 23 mg/dL 12   Creatinine 9.59 - 1.20 mg/dL 9.12   Sodium 864 - 854 mEq/L 139   Potassium 3.5 - 5.1 mEq/L 4.2   Chloride 96 - 112 mEq/L 102   CO2 19 - 32 mEq/L 27   Calcium 8.4 - 10.5 mg/dL 9.9   Total Protein 6.0 - 8.3 g/dL 7.5   Total Bilirubin 0.2 - 1.2 mg/dL 0.3   Alkaline Phos 39 - 117 U/L 50   AST 0 - 37 U/L 17   ALT 0 - 35 U/L 15    Edinburgh Score:    04/29/2024    8:20 AM  Edinburgh Postnatal Depression Scale Screening Tool  I have been able to laugh and see the funny side of  things. 0  I have looked forward with enjoyment to things. 0  I have blamed myself unnecessarily when things went wrong. 1  I have been anxious or worried for no good reason. 1  I have felt scared or panicky for no good reason. 0  Things have been getting on top of me. 1  I have been so unhappy that I have had difficulty sleeping. 1  I have felt sad or miserable. 0  I have been so unhappy that I have been crying. 1  The thought of harming myself has occurred to me. 0  Edinburgh Postnatal Depression Scale Total 5      After visit meds:  Allergies as of 04/30/2024   No Known Allergies      Medication List     TAKE these medications    acetaminophen  500 MG tablet Commonly known as: TYLENOL  Take 2 tablets (1,000 mg total) by mouth every 6 (six) hours.   benzocaine -Menthol  20-0.5 % Aero Commonly known  as: DERMOPLAST Apply 1 Application topically as needed for irritation (perineal discomfort).   ferrous sulfate 325 (65 FE) MG EC tablet Take 325 mg by mouth 3 (three) times daily with meals.   ibuprofen  600 MG tablet Commonly known as: ADVIL  Take 1 tablet (600 mg total) by mouth every 6 (six) hours.   levalbuterol  45 MCG/ACT inhaler Commonly known as: Xopenex  HFA Inhale 1-2 puffs into the lungs every 4 (four) hours as needed for wheezing.   prenatal multivitamin Tabs tablet Take 1 tablet by mouth daily at 12 noon.   senna-docusate 8.6-50 MG tablet Commonly known as: Senokot-S Take 2 tablets by mouth daily.         Discharge home in stable condition Infant Feeding: Breast Infant Disposition:home with mother Discharge instruction: per After Visit Summary and Postpartum booklet. Activity: Advance as tolerated. Pelvic rest for 6 weeks.  Diet: routine diet Anticipated Birth Control: Unsure Postpartum Appointment:6 weeks Additional Postpartum F/U: 6 weeks Future Appointments:No future appointments. Follow up Visit:      04/30/2024 Sherrilee KANDICE Both, CNM

## 2024-05-04 ENCOUNTER — Inpatient Hospital Stay (HOSPITAL_COMMUNITY)

## 2024-05-04 ENCOUNTER — Inpatient Hospital Stay (HOSPITAL_COMMUNITY): Admission: RE | Admit: 2024-05-04 | Source: Home / Self Care | Admitting: Obstetrics & Gynecology

## 2024-05-10 ENCOUNTER — Other Ambulatory Visit (HOSPITAL_BASED_OUTPATIENT_CLINIC_OR_DEPARTMENT_OTHER): Payer: Self-pay

## 2024-05-18 ENCOUNTER — Telehealth (HOSPITAL_COMMUNITY): Payer: Self-pay

## 2024-05-18 NOTE — Telephone Encounter (Signed)
 05/18/2024 1345  Name: Nini Cavan MRN: [id number] DOB: May 03, 1991  Reason for Call:  Transition of Care Hospital Discharge Call  Contact Status: Patient Contact Status: Complete  Language assistant needed:          Follow-Up Questions: Do You Have Any Concerns About Your Health As You Heal From Delivery?: No Do You Have Any Concerns About Your Infants Health?: No  Edinburgh Postnatal Depression Scale:  In the Past 7 Days: I have been able to laugh and see the funny side of things.: As much as I always could I have looked forward with enjoyment to things.: As much as I ever did I have blamed myself unnecessarily when things went wrong.: Not very often I have been anxious or worried for no good reason.: Hardly ever I have felt scared or panicky for no good reason.: No, not at all Things have been getting on top of me.: No, most of the time I have coped quite well I have been so unhappy that I have had difficulty sleeping.: Not at all I have felt sad or miserable.: No, not at all I have been so unhappy that I have been crying.: Only occasionally The thought of harming myself has occurred to me.: Never Edinburgh Postnatal Depression Scale Total: 4  PHQ2-9 Depression Scale:     Discharge Follow-up: Edinburgh score requires follow up?: No Patient was advised of the following resources:: Breastfeeding Support Group, Support Group  Post-discharge interventions: Reviewed Newborn Safe Sleep Practices  Signature  Rosaline Deretha PEAK
